# Patient Record
Sex: Female | Born: 1945 | Race: White | Hispanic: No | Marital: Married | Smoking: Current every day smoker
Health system: Southern US, Community
[De-identification: ages and names within clinical notes are randomized; demographics above are authoritative.]

## PROBLEM LIST (undated history)

## (undated) DIAGNOSIS — Z85850 Personal history of malignant neoplasm of thyroid: Secondary | ICD-10-CM

## (undated) DIAGNOSIS — R21 Rash and other nonspecific skin eruption: Secondary | ICD-10-CM

## (undated) DIAGNOSIS — Z1211 Encounter for screening for malignant neoplasm of colon: Secondary | ICD-10-CM

## (undated) DIAGNOSIS — R195 Other fecal abnormalities: Secondary | ICD-10-CM

## (undated) DIAGNOSIS — N183 Chronic kidney disease, stage 3 unspecified: Secondary | ICD-10-CM

## (undated) DIAGNOSIS — E162 Hypoglycemia, unspecified: Secondary | ICD-10-CM

## (undated) DIAGNOSIS — L97509 Non-pressure chronic ulcer of other part of unspecified foot with unspecified severity: Secondary | ICD-10-CM

## (undated) DIAGNOSIS — T4145XA Adverse effect of unspecified anesthetic, initial encounter: Secondary | ICD-10-CM

## (undated) DIAGNOSIS — T8859XA Other complications of anesthesia, initial encounter: Secondary | ICD-10-CM

## (undated) DIAGNOSIS — Z87442 Personal history of urinary calculi: Secondary | ICD-10-CM

## (undated) DIAGNOSIS — I10 Essential (primary) hypertension: Secondary | ICD-10-CM

## (undated) DIAGNOSIS — G629 Polyneuropathy, unspecified: Secondary | ICD-10-CM

## (undated) DIAGNOSIS — C519 Malignant neoplasm of vulva, unspecified: Secondary | ICD-10-CM

## (undated) DIAGNOSIS — G43909 Migraine, unspecified, not intractable, without status migrainosus: Secondary | ICD-10-CM

## (undated) DIAGNOSIS — F329 Major depressive disorder, single episode, unspecified: Secondary | ICD-10-CM

## (undated) DIAGNOSIS — F32A Depression, unspecified: Secondary | ICD-10-CM

## (undated) DIAGNOSIS — M1990 Unspecified osteoarthritis, unspecified site: Secondary | ICD-10-CM

## (undated) DIAGNOSIS — G35 Multiple sclerosis: Secondary | ICD-10-CM

## (undated) DIAGNOSIS — N390 Urinary tract infection, site not specified: Secondary | ICD-10-CM

## (undated) DIAGNOSIS — C73 Malignant neoplasm of thyroid gland: Secondary | ICD-10-CM

## (undated) HISTORY — DX: Malignant neoplasm of thyroid gland: C73

## (undated) HISTORY — DX: Essential (primary) hypertension: I10

## (undated) HISTORY — PX: LAPAROSCOPIC CHOLECYSTECTOMY: SUR755

## (undated) HISTORY — PX: TOTAL THYROIDECTOMY: SHX2547

## (undated) HISTORY — PX: BREAST BIOPSY: SHX20

## (undated) HISTORY — PX: TOE AMPUTATION: SHX809

## (undated) HISTORY — PX: TUBAL LIGATION: SHX77

## (undated) HISTORY — PX: CERVICAL FUSION: SHX112

## (undated) HISTORY — PX: TONSILLECTOMY AND ADENOIDECTOMY: SUR1326

## (undated) HISTORY — DX: Multiple sclerosis: G35

## (undated) HISTORY — PX: OTHER SURGICAL HISTORY: SHX169

---

## 1976-09-18 HISTORY — PX: ABDOMINAL HYSTERECTOMY: SHX81

## 1993-09-18 HISTORY — PX: OOPHORECTOMY: SHX86

## 1998-12-13 ENCOUNTER — Ambulatory Visit (HOSPITAL_COMMUNITY): Admission: RE | Admit: 1998-12-13 | Discharge: 1998-12-13 | Payer: Self-pay | Admitting: *Deleted

## 1998-12-17 ENCOUNTER — Ambulatory Visit (HOSPITAL_COMMUNITY): Admission: RE | Admit: 1998-12-17 | Discharge: 1998-12-17 | Payer: Self-pay | Admitting: *Deleted

## 2000-07-19 ENCOUNTER — Encounter (INDEPENDENT_AMBULATORY_CARE_PROVIDER_SITE_OTHER): Payer: Self-pay | Admitting: Specialist

## 2000-07-19 ENCOUNTER — Ambulatory Visit (HOSPITAL_COMMUNITY): Admission: RE | Admit: 2000-07-19 | Discharge: 2000-07-19 | Payer: Self-pay | Admitting: Gastroenterology

## 2000-08-15 ENCOUNTER — Ambulatory Visit (HOSPITAL_COMMUNITY): Admission: RE | Admit: 2000-08-15 | Discharge: 2000-08-15 | Payer: Self-pay | Admitting: Gastroenterology

## 2000-08-15 ENCOUNTER — Encounter (INDEPENDENT_AMBULATORY_CARE_PROVIDER_SITE_OTHER): Payer: Self-pay | Admitting: Specialist

## 2000-08-15 HISTORY — PX: COLONOSCOPY: SHX174

## 2001-11-08 ENCOUNTER — Encounter: Admission: RE | Admit: 2001-11-08 | Discharge: 2002-02-06 | Payer: Self-pay | Admitting: Family Medicine

## 2002-02-19 ENCOUNTER — Encounter: Payer: Self-pay | Admitting: Urology

## 2002-02-19 ENCOUNTER — Encounter: Admission: RE | Admit: 2002-02-19 | Discharge: 2002-02-19 | Payer: Self-pay | Admitting: Urology

## 2003-09-19 HISTORY — PX: BLADDER SUSPENSION: SHX72

## 2003-10-22 ENCOUNTER — Other Ambulatory Visit: Admission: RE | Admit: 2003-10-22 | Discharge: 2003-10-22 | Payer: Self-pay | Admitting: Gastroenterology

## 2004-10-10 ENCOUNTER — Encounter (HOSPITAL_COMMUNITY): Admission: RE | Admit: 2004-10-10 | Discharge: 2005-01-08 | Payer: Self-pay | Admitting: Endocrinology

## 2005-06-07 ENCOUNTER — Ambulatory Visit (HOSPITAL_COMMUNITY): Admission: RE | Admit: 2005-06-07 | Discharge: 2005-06-07 | Payer: Self-pay | Admitting: Endocrinology

## 2006-03-26 ENCOUNTER — Encounter (HOSPITAL_COMMUNITY): Admission: RE | Admit: 2006-03-26 | Discharge: 2006-05-30 | Payer: Self-pay | Admitting: Endocrinology

## 2006-04-16 ENCOUNTER — Encounter: Admission: RE | Admit: 2006-04-16 | Discharge: 2006-04-16 | Payer: Self-pay | Admitting: Family Medicine

## 2007-04-29 ENCOUNTER — Encounter: Admission: RE | Admit: 2007-04-29 | Discharge: 2007-04-29 | Payer: Self-pay | Admitting: Family Medicine

## 2007-10-28 ENCOUNTER — Other Ambulatory Visit: Admission: RE | Admit: 2007-10-28 | Discharge: 2007-10-28 | Payer: Self-pay | Admitting: Internal Medicine

## 2007-11-11 ENCOUNTER — Encounter (HOSPITAL_COMMUNITY): Admission: RE | Admit: 2007-11-11 | Discharge: 2008-02-03 | Payer: Self-pay | Admitting: Endocrinology

## 2008-06-30 ENCOUNTER — Encounter: Admission: RE | Admit: 2008-06-30 | Discharge: 2008-06-30 | Payer: Self-pay | Admitting: Internal Medicine

## 2008-12-04 ENCOUNTER — Inpatient Hospital Stay (HOSPITAL_COMMUNITY): Admission: EM | Admit: 2008-12-04 | Discharge: 2008-12-04 | Payer: Self-pay | Admitting: Emergency Medicine

## 2008-12-04 HISTORY — PX: CARDIAC CATHETERIZATION: SHX172

## 2010-10-09 ENCOUNTER — Encounter: Payer: Self-pay | Admitting: Internal Medicine

## 2010-10-09 ENCOUNTER — Encounter: Payer: Self-pay | Admitting: Family Medicine

## 2010-10-09 ENCOUNTER — Encounter: Payer: Self-pay | Admitting: Endocrinology

## 2010-10-17 ENCOUNTER — Encounter
Admission: RE | Admit: 2010-10-17 | Discharge: 2010-10-17 | Payer: Self-pay | Source: Home / Self Care | Attending: Internal Medicine | Admitting: Internal Medicine

## 2010-11-03 ENCOUNTER — Other Ambulatory Visit: Payer: Self-pay | Admitting: Internal Medicine

## 2010-11-03 DIAGNOSIS — N289 Disorder of kidney and ureter, unspecified: Secondary | ICD-10-CM

## 2010-11-08 ENCOUNTER — Other Ambulatory Visit: Payer: Self-pay

## 2010-12-29 LAB — CK TOTAL AND CKMB (NOT AT ARMC)
CK, MB: 5.2 ng/mL — ABNORMAL HIGH (ref 0.3–4.0)
Relative Index: 4.2 — ABNORMAL HIGH (ref 0.0–2.5)
Total CK: 124 U/L (ref 7–177)

## 2010-12-29 LAB — DIFFERENTIAL
Basophils Absolute: 0 10*3/uL (ref 0.0–0.1)
Basophils Relative: 0 % (ref 0–1)
Eosinophils Relative: 3 % (ref 0–5)
Lymphocytes Relative: 29 % (ref 12–46)
Lymphs Abs: 2 10*3/uL (ref 0.7–4.0)
Monocytes Relative: 6 % (ref 3–12)
Neutro Abs: 4.3 10*3/uL (ref 1.7–7.7)

## 2010-12-29 LAB — BASIC METABOLIC PANEL
CO2: 23 mEq/L (ref 19–32)
GFR calc Af Amer: >60 mL/min (ref >60)
GFR calc non Af Amer: >60 mL/min (ref >60)
Glucose, Bld: 100 mg/dL — ABNORMAL HIGH (ref 70–99)
Potassium: 3.5 mEq/L (ref 3.5–5.1)
Sodium: 134 mEq/L — ABNORMAL LOW (ref 135–145)

## 2010-12-29 LAB — LIPID PANEL
Cholesterol: 204 mg/dL — ABNORMAL HIGH (ref 0–200)
HDL: 38 mg/dL — ABNORMAL LOW (ref >39)
LDL Cholesterol: UNDETERMINED mg/dL (ref 0–99)
Total CHOL/HDL Ratio: 5.4 RATIO

## 2010-12-29 LAB — COMPREHENSIVE METABOLIC PANEL
Alkaline Phosphatase: 70 U/L (ref 39–117)
BUN: 13 mg/dL (ref 6–23)
CO2: 25 mEq/L (ref 19–32)
Calcium: 7.8 mg/dL — ABNORMAL LOW (ref 8.4–10.5)
Chloride: 100 mEq/L (ref 96–112)
GFR calc Af Amer: >60 mL/min (ref >60)
GFR calc non Af Amer: >60 mL/min (ref >60)
Potassium: 4.4 mEq/L (ref 3.5–5.1)
Total Bilirubin: 0.8 mg/dL (ref 0.3–1.2)

## 2010-12-29 LAB — POCT I-STAT, CHEM 8
BUN: 16 mg/dL (ref 6–23)
Calcium, Ion: 0.88 mmol/L — ABNORMAL LOW (ref 1.12–1.32)
Creatinine, Ser: 0.9 mg/dL (ref 0.4–1.2)
Glucose, Bld: 101 mg/dL — ABNORMAL HIGH (ref 70–99)
Potassium: 3.6 mEq/L (ref 3.5–5.1)

## 2010-12-29 LAB — POCT CARDIAC MARKERS
CKMB, poc: 3.9 ng/mL (ref 1.0–8.0)
CKMB, poc: 4 ng/mL (ref 1.0–8.0)
Troponin i, poc: <0.05 ng/mL (ref 0.00–0.09)

## 2010-12-29 LAB — HEPARIN LEVEL (UNFRACTIONATED): Heparin Unfractionated: <0.1 IU/mL — ABNORMAL LOW (ref 0.30–0.70)

## 2010-12-29 LAB — CBC
MCHC: 34 g/dL (ref 30.0–36.0)
RDW: 14.5 % (ref 11.5–15.5)

## 2010-12-29 LAB — MAGNESIUM: Magnesium: 2.4 mg/dL (ref 1.5–2.5)

## 2010-12-29 LAB — CARDIAC PANEL(CRET KIN+CKTOT+MB+TROPI): Total CK: 157 U/L (ref 7–177)

## 2011-01-31 NOTE — Discharge Summary (Signed)
Rhonda Butler, MACKNIGHT              ACCOUNT NO.:  [id number]   MEDICAL RECORD NO.:  [id number]          PATIENT TYPE:  INP   LOCATION:  3710                         FACILITY:  [facility]   PHYSICIAN:  Mohan N. Sharyn Lull, M.D. DATE OF BIRTH:  1945/10/14   DATE OF ADMISSION:  12/04/2008  DATE OF DISCHARGE:  12/04/2008                               DISCHARGE SUMMARY   ADMITTING DIAGNOSES:  1. Chest pain, worrisome for angina, rule out myocardial infarction  2. Uncontrolled hypertension.  3. History of tobacco abuse.  4. Morbid obesity.  5. Hypothyroidism.  6. History of cancer of the thyroid.  7. Multiple sclerosis.  8. Positive family history of coronary artery disease.   DISCHARGE DIAGNOSES:  1. Status post chest pain.  2. Mild coronary artery disease, status post left catheterization.  3. Rule out gastroesophageal reflux disease.  4. Hypertension.  5. Elevated blood sugar, rule out diabetes mellitus.  6. Hypercholesteremia.  7. History of tobacco abuse.  8. Morbid obesity.  9. Hypothyroidism.  10.History of cancer of thyroid.  11.Multiple sclerosis.  12.Positive family history of coronary artery disease.   DISCHARGE HOME MEDICATIONS:  1. Enteric-coated aspirin 81 mg 1 tablet daily.  2. Toprol-XL 100 mg 1 tablet daily.  3. Norvasc 5 mg 1 tablet daily.  4. Lopid 600 mg 1 tablet twice daily.  5. Cymbalta 30 mg 1 capsule daily.  6. Levoxyl 112 mcg 1 tablet daily.  7. Calcitriol 0.5 mcg 1 capsule twice daily as before.  8. Amantadine 100 mg 1 capsule twice daily.  9. Estradiol 1 mg 1 tablet daily as before.   DIET:  Low salt, low cholesterol.  The patient has been advised to avoid  sweets.   Post-cardiac cath instructions have been given.  No driving, no lifting,  no pushing or pulling for 1 week.  Fasting blood sugar and hemoglobin  A1c in 1 week.   CONDITION AT DISCHARGE:  Stable.   FOLLOWUP:  With me in 1 week.   BRIEF HISTORY AND HOSPITAL COURSE:  Rhonda Butler is a  65 year old white  female with past medical history significant for hypertension, multiple  sclerosis, tobacco abuse, morbid obesity, history of CA of thyroid, and  positive family history of coronary artery disease.  She came to the ER  complaining of retrosternal chest heaviness and pressure, grade 7/10,  associated with nausea and mild shortness of breath.  The patient gives  history of exertional dyspnea associated with feeling weak and  diaphoretic.  She states she had stress test more than 1 year ago which  was negative.  Denies any palpitation, lightheadedness, or syncope.  Denies relation of chest pain to food, breathing, or movement.   PAST MEDICAL HISTORY:  As above.   PAST SURGICAL HISTORY:  She had a partial thyroidectomy in the past, had  cholecystectomy in the past, had tonsillectomy in the past, had bladder  sling surgery in the past, and had cervical neck fusion in the past.   ALLERGIES:  She is allergic to MORPHINE, SULFA, and DARVON.   SOCIAL HISTORY:  She is married, 2 children, retired Industrial/product designer  Water engineer.  She smoked more than 2 packs per day for 30+ years, quit 11  years ago.  No history of alcohol abuse.   FAMILY HISTORY:  Father died of MI, he also had stroke.  Mother died of  probable lymphocytic leukemia.  One sister had lupus.   PHYSICAL EXAMINATION:  GENERAL:  She is alert, awake, and oriented x3 in  no acute distress.  VITAL SIGNS:  Her initial blood pressure when she came to the hospital  was 205/100, repeat blood pressure was 165/86.  Pulse was 69 and  regular.  HEENT:  Conjunctivae were pink.  NECK:  Supple.  No JVD.  No bruit.  LUNGS:  Clear to auscultation without rhonchi or rales.  CARDIOVASCULAR:  S1 and S2 was normal.  There was soft systolic murmur  and S4 gallop.  ABDOMEN:  Soft, bowel sounds were present, nontender.  EXTREMITIES:  There is no clubbing, cyanosis, or edema.   LABORATORY DATA:  Her hemoglobin was 12.8, hematocrit 37.6,  white count  of 6.9, and platelet count of 195,000.  Her sodium was 134, potassium  3.5, chloride 100, bicarb 23, glucose 100, BUN 14, creatinine 0.67, and  magnesium was 2.4.  Troponin I was less than 0.01.  CPK was 124 and MB  5.2.  Second set CPK-MB was 4.  Troponin I point of care was less than  0.05.  Third set CK point of care was 3.9.  Troponin I was less than  0.05.  Her cholesterol was 204, triglycerides were 960, and HDL was 38.   BRIEF HOSPITAL COURSE:  The patient was admitted to telemetry unit.  MI  was ruled out by serial enzymes and EKG.  Discussed with the patient at  length regarding noninvasive stress testing versus left cath, possible  PTCA stenting, its risks and benefits, i.e., death, MI, stroke, need for  emergency CABG, risk of restenosis, local vascular complications, etc.,  and consented for PCI.  The patient subsequently underwent left cardiac  cath with selective left and right coronary angiography as per procedure  report.  The patient tolerated the procedure well.  There are no  complications.  Postprocedure, the patient did not have any episodes of  chest pain.  Her groin is stable with no  evidence of hematoma or bruit.  The patient will be discharged home  later this evening if her groin remains stable and she remains  hemodynamically stable.  Her blood pressure has come down in 140/80  range.  The patient will be discharged home on above medications and  will be followed up in my office in 1 week.      Eduardo Osier. Sharyn Lull, M.D.  Electronically Signed     MNH/MEDQ  D:  12/04/2008  T:  12/05/2008  Job:  [id number]

## 2011-01-31 NOTE — Cardiovascular Report (Signed)
NAMEKAREE, FORGE              ACCOUNT NO.:  [id number]   MEDICAL RECORD NO.:  [id number]          PATIENT TYPE:  INP   LOCATION:  3710                         FACILITY:  [facility]   PHYSICIAN:  Mohan N. Sharyn Lull, M.D. DATE OF BIRTH:  Jun 09, 1946   DATE OF PROCEDURE:  12/04/2008  DATE OF DISCHARGE:  12/04/2008                            CARDIAC CATHETERIZATION   PROCEDURE:  Left cardiac catheterization with selective left and right  coronary angiography, left ventriculography via right groin using  Judkins technique.   INDICATIONS FOR PROCEDURE:  Ms. Mcgriff is a 65 year old white female  with past medical history significant for hypertension, multiple  sclerosis, history of tobacco abuse, morbid obesity, history of CA of  thyroid, and positive family history of coronary artery disease.  She  came to the ER complaining of retrosternal chest heaviness, pressure  grade 7/10 associated with nausea and mild shortness of breath.  The  patient gives history of exertional dyspnea associated with feeling weak  and diaphoresis.  States had stress test more than 1 year ago which was  normal.  Denies any palpitation, lightheadedness, or syncope.  Denies  relation of chest pain to food, breathing, or movement.   PAST MEDICAL HISTORY:  As above.   PAST SURGICAL HISTORY:  She had partial thyroidectomy in the past, had  cholecystectomy in the past, had tonsillectomy in the past, had bladder  sling surgery in the past, and had cervical neck fusion in the past.   ALLERGIES:  She is allergic to MORPHINE, SULFA, and DARVON.   SOCIAL HISTORY:  She is married, two children.  Retired Paramedic.  She smoked more than 2 packs per day for 30+ years, quit 11  years ago.  No history of alcohol abuse.   FAMILY HISTORY:  Father died of MI.  He had also stroke.  Mother died of  lymphocytic leukemia.  One sister had lupus.   PHYSICAL EXAMINATION:  GENERAL:  She is alert, awake, and oriented x3  in  no acute distress.  VITAL SIGNS:  Blood pressure was 165/86, pulse was 69 and regular.  HEENT:  Conjunctivae were pink.  NECK:  Supple.  No JVD, no bruit.  LUNGS:  Clear to auscultation without rhonchi or rales.  CARDIOVASCULAR:  S1-S2 were normal.  There was soft systolic murmur and  S4 gallop.  ABDOMEN:  Soft.  Bowel sounds were present and nontender.  EXTREMITIES:  There is no clubbing, cyanosis, or edema.   LABORATORY DATA:  Hemoglobin was 13.7 and hematocrit 39.  CPK was 124,  MB 5.2, and troponin I was 0.01.  EKG showed normal sinus rhythm with  nonspecific T-wave changes.   I discussed with the patient at length regarding noninvasive stress  testing versus left cath, possible PTCA and stenting, its risks and  benefits, i.e. death, MI, stroke, need for emergency CABG, risk of  restenosis, local vascular complications, etc. and consented for PCI.   PROCEDURE IN DETAIL:  After obtaining the informed consent, the patient  was brought to the cath lab and was placed on fluoroscopy table.  Right  groin was  prepped and draped in usual fashion.  Xylocaine 2% was used  for local anesthesia in the right groin.  With the help of thin-walled  needle, a 6-French arterial sheath was placed.  The sheath was aspirated  and flushed.  Next, a 6-French left Judkins catheter was advanced over  the wire under fluoroscopic guidance up to the ascending aorta.  Wire  was pulled out.  The catheter was aspirated and connected to the  manifold.  Catheter was further advanced and engaged into the left  coronary ostium.  Multiple views of the left system were taken.  Next,  the catheter was disengaged and was pulled out over the wire and was  replaced with 6-French right Judkins catheter which was advanced over  the wire under fluoroscopic guidance up to the ascending aorta.  Wire  was pulled out.  The catheter was aspirated and connected to the  manifold.  Catheter was further advanced and engaged  into right coronary  ostium.  Multiple views of the right system were taken.  Next, the  catheter was disengaged and was pulled out over the wire and was  replaced with 6-French pigtail catheter which was advanced over the wire  under fluoroscopic guidance up to the ascending aorta.  Wire was pulled  out.  The catheter was aspirated and connected to the manifold.  Catheter was further advanced across the aortic valve into the LV.  LV  pressures were recorded.  Next, LV-graphy was done in 30-degree RAO  position.  Post-angiographic pressures were recorded from LV and then  pullback pressures were recorded from the aorta.  There was no gradient  across the aortic valve.  Next, the pigtail catheter was pulled out over  the wire.  Sheaths were aspirated and flushed.   FINDINGS:  LV showed good LV systolic function.  EF of 55-60%.  Left  main was patent.  LAD has 10-15% proximal and mid stenosis.  Diagonal 1  was moderate size which was patent.  Diagonal 2 was small which was  patent.  Left circumflex has 10-15% ostial stenosis.  OM-1 and OM-2 were  very very small.  OM-3 was moderate size which was patent.  OM-4 was  small which was patent.  RCA has 10-15% ostial and 10-20% proximal and  mid stenosis.  PDA and PLV branches were patent.  The patient tolerated  the procedure well.  There were no complications.  The patient was  transferred to the recovery room in stable condition.      Eduardo Osier. Sharyn Lull, M.D.  Electronically Signed     MNH/MEDQ  D:  12/04/2008  T:  12/05/2008  Job:  [id number]

## 2011-02-03 NOTE — Procedures (Signed)
[facility]  Patient:    Rhonda Butler, Rhonda Butler                     MRN: [id number] Proc. Date: 07/19/00 Adm. Date:  [id number] Attending:  Rich Brave                           Procedure Report  PROCEDURE:  Upper endoscopy with biopsies.  INDICATION:  A 65 year old female with frequent liquid, oily stools, raising the question of possible malabsorption.  FINDINGS:  Small amount of bile reflux, otherwise unremarkable examination. Pathology pending.  DESCRIPTION OF PROCEDURE:  The nature, purpose, and risks of the procedure have been discussed with the patient, who provided written consent.  Sedation was fentanyl 112.5 mcg and Versed 10 mg IV, without arrhythmias or desaturation.  The Olympus adult video endoscope was passed under direct vision.  The vocal cords looked normal, and the esophagus was easily entered.  The esophagus was endoscopically normal, without evidence of reflux esophagitis, Barretts esophagus, varices, infection, or neoplasia.  No ring or stricture was evident.  There was no hiatal hernia.  The stomach was entered.  It contained a small to moderate bilious residual, which was suctioned up.  The gastric mucosa was unremarkable, without evidence of gastritis, erosions, ulcers, polyps, or masses, including retroflex view of the proximal stomach.  The pylorus, duodenal bulb, and second duodenum looked normal.  In particular, there was no endoscopic evidence of typical markers of of celiac disease. However, multiple mucosal biopsies from the second and proximal third portion of the duodenum were obtained.  The scope was then removed, and the patient tolerated the procedure well and without apparent complication.  IMPRESSION:  Small amount of bile reflux, otherwise normal endoscopy.  PLAN:  Await pathology on duodenal biopsies.  Proceed to sigmoidoscopic evaluation. DD:  07/19/00 TD:  07/20/00 Job: [id number]

## 2011-02-03 NOTE — Procedures (Signed)
[facility]  Patient:    Rhonda Butler, Rhonda Butler                     MRN: [id number] Proc. Date: 07/19/00 Adm. Date:  [id number] Attending:  Rich Brave                           Procedure Report  PROCEDURE:  Flexible sigmoidoscopy with biopsies.  INDICATION:  A 65 year old female with diarrhea and previous history of possible ulcerative colitis.  FINDINGS:  No evidence of ulcerative colitis identified.  A 3-4 mm sessile polyp biopsied near splenic flexure.  DESCRIPTION OF PROCEDURE:  The nature, purpose, and risks of the procedure had been discussed with the patient, who provided written consent.  Sedation for this procedure was simply the sedation which had been given for her preceding upper endoscopy, fentanyl 112.5 mcg and Versed 10 mg IV, for a level of mild sedation without arrhythmias or desaturation.  The Olympus adult video upper endoscope which had been used for endoscopy was also used for this procedure and was advanced to about 65 or 70 cm, course running to the distal transverse colon, whereupon pullback was initiated.  At 60 cm there was a 3-4 mm sessile polyp, which I biopsied several times.  This was otherwise a normal exam without evidence of ulcerative colitis or other mucosal abnormalities and without other polyps, cancer, vascular malformations, or diverticulosis.  Random mucosal biopsies were obtained in the sigmoid colon and rectum, and retroflexion was consequently not performed.  The patient tolerated the procedure well, and there were no apparent complications.  IMPRESSION:  Small polyp in the distal transverse colon, otherwise unremarkable exam.  PLAN:  Await pathology.  If the polyp is adenomatous, arrange colonoscopy. DD:  07/19/00 TD:  07/20/00 Job: [id number]

## 2011-02-03 NOTE — Op Note (Signed)
[facility]  Patient:    Rhonda Butler, Rhonda Butler                     MRN: [id number] Proc. Date: 08/15/00 Adm. Date:  [id number] Disc. Date: [id number] Attending:  Rich Brave                           Operative Report  PROCEDURE:  Colonoscopy.  ENDOSCOPIST:  Florencia Reasons, M.D.  INDICATIONS:  Small adenoma seen and removed on recent flexible sigmoidoscopy. Rule out synchronous lesions elsewhere in the colon.  FINDINGS:  Normal exam to cecum except for retained stool.  DESCRIPTION OF PROCEDURE:  The nature, purpose, and risks of the procedure were familiar to the patient who provided written consent.  Sedation was fentanyl 125 mcg and Versed 12 mg IV prior to and during the course of the procedure without arrhythmias or desaturation.  The Olympus adult video colonoscope was advanced to the cecum, turning the patient into the supine position and applying some external abdominal compression to facilitate passage.  The cecum was identified by clear visualization of the ileocecal valve and the absence of further lumen.  Pullback was then performed.  There was a moderate amount of solid formed stool scattered here and there, both in the cecum and also in the proximal and mid colon which could have obscured small lesions.  In many cases, I was able to irrigate this out of the way, but, in other cases, there was too much to irrigate or simply seemed that irrigation did not expose the underlying mucosa; so there were areas of mucosa that were not visualized during this exam.  From a practical standpoint, I do not think it would have been feasible to adequately wash out the entire colon.  No polyps, cancer, colitis, vascular malformations, or diverticular disease were observed during this exam.  The patient has a past history of supposed "ulcerative colitis," although I do not have documentation of that, and there was certainly no endoscopic  evidence of colitis at this time, but I did obtain random mucosal biopsies along the length of the colon to look for histologic evidence of inflammation.  Retroflexion was not performed in the rectum.  The patient tolerated this procedure well, and there were no apparent complications.  IMPRESSION: 1. No evidence of current active colitis. 2. No evidence of synchronous polyps, status post removal of a small adenoma    from the region of the splenic flexure on sigmoidoscopy about a month ago.  PLAN:  Await pathology on current biopsies.  Followup colonoscopy in five years in view of the history of the adenoma having recently been removed.DD: 08/15/00 TD:  08/15/00 Job: [id number]

## 2011-06-12 LAB — THYROGLOBULIN LEVEL: Antithyroglobulin Ab: 0.6 IU/mL (ref 0.0–14.4)

## 2012-02-29 ENCOUNTER — Ambulatory Visit
Admission: RE | Admit: 2012-02-29 | Discharge: 2012-02-29 | Disposition: A | Discharge status: Home / Self Care | Payer: Managed Care, Other (non HMO) | Source: Ambulatory Visit | Attending: Specialist | Admitting: Specialist

## 2012-02-29 ENCOUNTER — Other Ambulatory Visit: Payer: Self-pay | Admitting: Specialist

## 2012-02-29 DIAGNOSIS — M479 Spondylosis, unspecified: Secondary | ICD-10-CM

## 2012-05-01 ENCOUNTER — Other Ambulatory Visit: Payer: Self-pay | Admitting: Specialist

## 2012-05-01 ENCOUNTER — Ambulatory Visit
Admission: RE | Admit: 2012-05-01 | Discharge: 2012-05-01 | Disposition: A | Discharge status: Home / Self Care | Payer: Managed Care, Other (non HMO) | Source: Ambulatory Visit | Attending: Specialist | Admitting: Specialist

## 2012-05-01 DIAGNOSIS — Z981 Arthrodesis status: Secondary | ICD-10-CM

## 2013-11-05 ENCOUNTER — Other Ambulatory Visit (HOSPITAL_COMMUNITY): Payer: Self-pay | Admitting: Cardiology

## 2013-11-05 DIAGNOSIS — N184 Chronic kidney disease, stage 4 (severe): Secondary | ICD-10-CM

## 2013-11-06 ENCOUNTER — Encounter: Payer: Self-pay | Admitting: Internal Medicine

## 2013-11-06 ENCOUNTER — Ambulatory Visit (HOSPITAL_COMMUNITY): Payer: BC Managed Care – PPO | Attending: Internal Medicine

## 2013-11-06 DIAGNOSIS — I10 Essential (primary) hypertension: Secondary | ICD-10-CM

## 2013-11-06 DIAGNOSIS — N189 Chronic kidney disease, unspecified: Secondary | ICD-10-CM | POA: Insufficient documentation

## 2013-11-06 DIAGNOSIS — N184 Chronic kidney disease, stage 4 (severe): Secondary | ICD-10-CM

## 2013-11-06 DIAGNOSIS — I129 Hypertensive chronic kidney disease with stage 1 through stage 4 chronic kidney disease, or unspecified chronic kidney disease: Secondary | ICD-10-CM | POA: Insufficient documentation

## 2013-11-06 DIAGNOSIS — I2510 Atherosclerotic heart disease of native coronary artery without angina pectoris: Secondary | ICD-10-CM | POA: Insufficient documentation

## 2013-11-06 DIAGNOSIS — E785 Hyperlipidemia, unspecified: Secondary | ICD-10-CM | POA: Insufficient documentation

## 2014-02-11 ENCOUNTER — Other Ambulatory Visit: Payer: Self-pay | Admitting: Internal Medicine

## 2014-02-11 DIAGNOSIS — E039 Hypothyroidism, unspecified: Secondary | ICD-10-CM

## 2014-02-13 ENCOUNTER — Ambulatory Visit
Admission: RE | Admit: 2014-02-13 | Discharge: 2014-02-13 | Disposition: A | Discharge status: Home / Self Care | Payer: BC Managed Care – PPO | Source: Ambulatory Visit | Attending: Internal Medicine | Admitting: Internal Medicine

## 2014-02-13 DIAGNOSIS — E039 Hypothyroidism, unspecified: Secondary | ICD-10-CM

## 2014-07-02 ENCOUNTER — Other Ambulatory Visit: Payer: Self-pay

## 2014-07-02 DIAGNOSIS — Z1231 Encounter for screening mammogram for malignant neoplasm of breast: Secondary | ICD-10-CM

## 2014-07-03 ENCOUNTER — Other Ambulatory Visit: Payer: Self-pay

## 2014-07-15 ENCOUNTER — Ambulatory Visit
Admission: RE | Admit: 2014-07-15 | Discharge: 2014-07-15 | Disposition: A | Discharge status: Home / Self Care | Payer: Medicare Other | Source: Ambulatory Visit

## 2014-07-15 DIAGNOSIS — Z1231 Encounter for screening mammogram for malignant neoplasm of breast: Secondary | ICD-10-CM

## 2014-11-04 ENCOUNTER — Other Ambulatory Visit (HOSPITAL_COMMUNITY): Payer: Self-pay | Admitting: Cardiology

## 2014-11-04 DIAGNOSIS — I739 Peripheral vascular disease, unspecified: Secondary | ICD-10-CM

## 2014-11-06 ENCOUNTER — Ambulatory Visit (HOSPITAL_COMMUNITY)
Admission: RE | Admit: 2014-11-06 | Discharge: 2014-11-06 | Disposition: A | Discharge status: Home / Self Care | Payer: BLUE CROSS/BLUE SHIELD | Source: Ambulatory Visit | Attending: Cardiology | Admitting: Cardiology

## 2014-11-06 DIAGNOSIS — L97529 Non-pressure chronic ulcer of other part of left foot with unspecified severity: Secondary | ICD-10-CM | POA: Insufficient documentation

## 2014-11-06 DIAGNOSIS — I739 Peripheral vascular disease, unspecified: Secondary | ICD-10-CM | POA: Diagnosis not present

## 2014-11-06 DIAGNOSIS — R200 Anesthesia of skin: Secondary | ICD-10-CM | POA: Diagnosis not present

## 2014-11-06 DIAGNOSIS — R202 Paresthesia of skin: Secondary | ICD-10-CM | POA: Insufficient documentation

## 2014-11-06 DIAGNOSIS — L97509 Non-pressure chronic ulcer of other part of unspecified foot with unspecified severity: Secondary | ICD-10-CM | POA: Diagnosis present

## 2014-11-06 DIAGNOSIS — L97519 Non-pressure chronic ulcer of other part of right foot with unspecified severity: Secondary | ICD-10-CM | POA: Diagnosis not present

## 2015-03-15 ENCOUNTER — Other Ambulatory Visit: Payer: Self-pay

## 2015-06-18 ENCOUNTER — Other Ambulatory Visit: Payer: Self-pay

## 2015-06-18 DIAGNOSIS — Z1231 Encounter for screening mammogram for malignant neoplasm of breast: Secondary | ICD-10-CM

## 2015-07-28 ENCOUNTER — Ambulatory Visit: Payer: BLUE CROSS/BLUE SHIELD

## 2015-08-31 ENCOUNTER — Ambulatory Visit
Admission: RE | Admit: 2015-08-31 | Discharge: 2015-08-31 | Disposition: A | Discharge status: Home / Self Care | Payer: BLUE CROSS/BLUE SHIELD | Source: Ambulatory Visit

## 2015-08-31 DIAGNOSIS — Z1231 Encounter for screening mammogram for malignant neoplasm of breast: Secondary | ICD-10-CM

## 2015-09-06 ENCOUNTER — Other Ambulatory Visit: Payer: Self-pay | Admitting: Internal Medicine

## 2015-09-06 DIAGNOSIS — R928 Other abnormal and inconclusive findings on diagnostic imaging of breast: Secondary | ICD-10-CM

## 2015-09-09 ENCOUNTER — Ambulatory Visit
Admission: RE | Admit: 2015-09-09 | Discharge: 2015-09-09 | Disposition: A | Discharge status: Home / Self Care | Payer: BLUE CROSS/BLUE SHIELD | Source: Ambulatory Visit | Attending: Internal Medicine | Admitting: Internal Medicine

## 2015-09-09 ENCOUNTER — Ambulatory Visit
Admission: RE | Admit: 2015-09-09 | Discharge: 2015-09-09 | Disposition: A | Discharge status: Home / Self Care | Payer: Medicare Other | Source: Ambulatory Visit | Attending: Internal Medicine | Admitting: Internal Medicine

## 2015-09-09 DIAGNOSIS — R928 Other abnormal and inconclusive findings on diagnostic imaging of breast: Secondary | ICD-10-CM

## 2016-08-07 ENCOUNTER — Other Ambulatory Visit: Payer: Self-pay | Admitting: Internal Medicine

## 2016-08-07 DIAGNOSIS — Z1231 Encounter for screening mammogram for malignant neoplasm of breast: Secondary | ICD-10-CM

## 2016-09-15 ENCOUNTER — Ambulatory Visit
Admission: RE | Admit: 2016-09-15 | Discharge: 2016-09-15 | Disposition: A | Discharge status: Home / Self Care | Payer: Medicare Other | Source: Ambulatory Visit | Attending: Internal Medicine | Admitting: Internal Medicine

## 2016-09-15 DIAGNOSIS — Z1231 Encounter for screening mammogram for malignant neoplasm of breast: Secondary | ICD-10-CM

## 2017-08-15 ENCOUNTER — Other Ambulatory Visit: Payer: Self-pay | Admitting: Internal Medicine

## 2017-08-15 DIAGNOSIS — Z1231 Encounter for screening mammogram for malignant neoplasm of breast: Secondary | ICD-10-CM

## 2017-09-25 ENCOUNTER — Ambulatory Visit
Admission: RE | Admit: 2017-09-25 | Discharge: 2017-09-25 | Disposition: A | Discharge status: Home / Self Care | Payer: Medicare Other | Source: Ambulatory Visit | Attending: Internal Medicine | Admitting: Internal Medicine

## 2017-09-25 DIAGNOSIS — Z1231 Encounter for screening mammogram for malignant neoplasm of breast: Secondary | ICD-10-CM

## 2017-12-18 ENCOUNTER — Ambulatory Visit (INDEPENDENT_AMBULATORY_CARE_PROVIDER_SITE_OTHER): Payer: Medicare Other | Admitting: Gynecology

## 2017-12-18 ENCOUNTER — Encounter: Payer: Self-pay | Admitting: Gynecology

## 2017-12-18 VITALS — BP 120/78 | Ht 61.0 in | Wt 154.0 lb

## 2017-12-18 DIAGNOSIS — Z1272 Encounter for screening for malignant neoplasm of vagina: Secondary | ICD-10-CM | POA: Diagnosis not present

## 2017-12-18 DIAGNOSIS — Z01411 Encounter for gynecological examination (general) (routine) with abnormal findings: Secondary | ICD-10-CM

## 2017-12-18 DIAGNOSIS — N952 Postmenopausal atrophic vaginitis: Secondary | ICD-10-CM | POA: Diagnosis not present

## 2017-12-18 DIAGNOSIS — Z779 Other contact with and (suspected) exposures hazardous to health: Secondary | ICD-10-CM | POA: Diagnosis not present

## 2017-12-18 DIAGNOSIS — N9089 Other specified noninflammatory disorders of vulva and perineum: Secondary | ICD-10-CM

## 2017-12-18 NOTE — Patient Instructions (Signed)
Follow-up for the colposcopy appointment as scheduled to look at the areas  on the vulva.  Schedule a screening colonoscopy

## 2017-12-18 NOTE — Addendum Note (Signed)
Addended by: Alen Blew on: 12/18/2017 02:43 PM   Modules accepted: Level of Service

## 2017-12-18 NOTE — Progress Notes (Signed)
Rhonda Butler Jan 25, 1946 [id number]        72 y.o.  G2P2 patient who presents for 2 reasons:  1. Has not had a GYN exam in a number of years.  History of abnormal Pap smears in the past proceeding her hysterectomy which was performed for heavy bleeding.  She subsequently had a BSO as a separate procedure. 2. Tender left labial area.  First noticed over a year ago.  Has become more tender and has changed in pigmentation to self exam.  She feels now that she has a growth in this area.  Past medical history,surgical history, problem list, medications, allergies, family history and social history were all reviewed and documented as reviewed in the EPIC chart.  ROS:  Performed with pertinent positives and negatives included in the history, assessment and plan.   Additional significant findings : None   Exam: Caryn Bee assistant Vitals:   12/18/17 1025  BP: 120/78  Weight: 154 lb (69.9 kg)  Height: 5\' 1"  (1.549 m)   Body mass index is 29.1 kg/m.  General appearance:  Normal affect, orientation and appearance. Skin: Grossly normal HEENT: Without gross lesions.  No cervical or supraclavicular adenopathy. Thyroid normal.  Lungs:  Clear without wheezing, rales or rhonchi Cardiac: RR, without RMG Abdominal:  Soft, nontender, without masses, guarding, rebound, organomegaly or hernia Breasts:  Examined lying and sitting without masses, retractions, discharge or axillary adenopathy. Pelvic:  Ext, BUS, Vagina: With atrophic changes.  Tender leukoplakic area upper left labia minora.  Raised plaque-like hyperpigmented area medial to the leukoplakic area.  Several other scattered small pigmented areas not raised as diagrammed.  Pap smear of vaginal cuff taken.  Adnexa: Without masses or tenderness    Anus and perineum: Normal   Rectovaginal: Normal sphincter tone without palpated masses or tenderness.  Physical Exam  Genitourinary:          Assessment/Plan:  72 y.o. G2P2 new  patient.  1. Breast and pelvic exam. 1. Mammogram 09/2017.  Breast exam normal today.  Continue with annual mammography when due. 2. Colonoscopy 12 years ago.  Does have a history of polyps.  Recommended baseline screening colonoscopy now and patient agrees to call and schedule. 3. DEXA a number of years ago.  Patient status post thyroidectomy and parathyroidectomy for thyroid cancer.  Had been on PTH replacement with a history of hypercalcemia in the past.  Being followed now for this now as well as kidney disease by Dr Lorrene Reid in nephrology..  I recommended she discuss bone density studies with Dr. Lorrene Reid who would be able to help her with decisions as far as treatment if needed. 4. Pap smear a number of years ago.  Pap smear of vaginal cuff done today given my suspicion for VIN.  No history of significant abnormal Pap smears in the past.  Patient does note that her husband's previous wife had a history of genital warts. 5. Health maintenance.  No routine lab work done as patient does this elsewhere. 2. Vulvar lesions.  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  We discussed my suspicions and I recommended a follow-up colposcopy appointment to better define the lesions.  We will plan on biopsying the 2 more prominent lesions as described above as well as possible other lesions as visualized.  We discussed what to expect with the biopsy and the possibilities with the results.  The patient will schedule in follow-up for the colposcopy and biopsy appointment.  Additional 10 minute time in  excess of her breast and pelvic exam was spent in direct face to face counseling and coordination of care in regards to her vulvar lesions.     Anastasio Auerbach MD, 10:52 AM 12/18/2017

## 2017-12-18 NOTE — Addendum Note (Signed)
Addended by: Nelva Nay on: 12/18/2017 11:50 AM   Modules accepted: Orders

## 2017-12-19 ENCOUNTER — Telehealth: Payer: Self-pay | Admitting: *Deleted

## 2017-12-19 LAB — PAP IG W/ RFLX HPV ASCU

## 2017-12-19 NOTE — Telephone Encounter (Signed)
Pt informed with the below note. 

## 2017-12-19 NOTE — Telephone Encounter (Signed)
Pt was seen yesterday and thought her urine was going to be ran, I explained it was not due to no mention at OV about urinary symptoms. Pt said she started having symptoms burning with urination and frequent urination (which is not new for her). Patient said she doesn't feel "well". Please advise

## 2017-12-19 NOTE — Telephone Encounter (Signed)
Options include office visit for evaluation, drop off urine for urine analysis, follow-up with primary physician to evaluate just not feeling well

## 2017-12-26 ENCOUNTER — Encounter: Payer: Self-pay | Admitting: Gynecology

## 2017-12-26 ENCOUNTER — Telehealth: Payer: Self-pay | Admitting: *Deleted

## 2017-12-26 ENCOUNTER — Ambulatory Visit (INDEPENDENT_AMBULATORY_CARE_PROVIDER_SITE_OTHER): Payer: Medicare Other | Admitting: Gynecology

## 2017-12-26 VITALS — BP 118/76

## 2017-12-26 DIAGNOSIS — N901 Moderate vulvar dysplasia: Secondary | ICD-10-CM

## 2017-12-26 DIAGNOSIS — N9089 Other specified noninflammatory disorders of vulva and perineum: Secondary | ICD-10-CM

## 2017-12-26 NOTE — Telephone Encounter (Signed)
Prescription strength ibuprofen 800 mg every 8 hours as needed #30 no refill

## 2017-12-26 NOTE — Telephone Encounter (Signed)
Patient had C&B today called stating the biopsy areas have lots of discomfort, using Neosporin pain relief, but no relief. Pt said she feels nauseated due to discomfort. Patient would like something to help with this. Please advise

## 2017-12-26 NOTE — Progress Notes (Signed)
    Rhonda Butler 1946-04-22 [id number]        72 y.o.  G2P2 presents for vulvar biopsy.  Was recently seen after not having had a GYN exam in a number of years complaining of a tender area on the vulva.  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  Patient returns now for biopsy.  Past medical history,surgical history, problem list, medications, allergies, family history and social history were all reviewed and documented in the EPIC chart.  Directed ROS with pertinent positives and negatives documented in the history of present illness/assessment and plan.  Exam: Caryn Bee assistant Vitals:   12/26/17 1057  BP: 118/76   General appearance:  Normal External BUS vagina with atrophic changes.  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  Several well demarcated-like areas inner aspect of left labia minora onto upper introital region.  Colposcopy performed after acetic acid cleanse confirming leukoplakic raised lesion left upper labia minora.  Several well demarcated acetowhite changes inner aspect of the left labia as diagrammed. Physical Exam  Genitourinary:      Seizure: The skin overlying and surrounding the lesions was cleansed with Betadine.  The 2 biopsy site areas were infiltrated with 1% lidocaine and subsequently represent a biopsies from both areas taken and sent separately.  Silver nitrate/Monsel solution used for hemostasis.  Patient tolerated well.  Postoperative care discussed.  Assessment/Plan:  72 y.o. G2P2 with lesions suggestive of high-grade VIN/early invasion.  Representative biopsies taken.  Patient will follow-up for biopsy results.  We discussed various scenarios for VIN to include re-excision of the other areas, possible laser, possible referral to a gynecologic oncologist for more extensive excision.  If returns carcinoma patient knows regardless we will refer to gynecologic oncologist.    Anastasio Auerbach MD, 11:21 AM 12/26/2017

## 2017-12-26 NOTE — Patient Instructions (Signed)
Office will call with biopsy results 

## 2017-12-26 NOTE — Telephone Encounter (Signed)
Pt informed with the below note, declined Rx at the time states she has ibuprofen at home

## 2017-12-31 ENCOUNTER — Encounter: Payer: Self-pay | Admitting: Gynecology

## 2018-01-01 ENCOUNTER — Telehealth: Payer: Self-pay | Admitting: Gynecology

## 2018-01-01 NOTE — Telephone Encounter (Signed)
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  I discussed the diagnosis of cancer and the low likelihood of metastases.  The need for local excision and possible lymph node assessment discussed.  My recommendation is that she is evaluated and managed by a gynecologic oncologist which she agrees with and we will go ahead and make these arrangements.    Set up an appointment with a gynecologic oncologist

## 2018-01-01 NOTE — Telephone Encounter (Signed)
Sent message to GYN oncologist to set up appointment.

## 2018-01-02 NOTE — Telephone Encounter (Signed)
Pt scheduled on 01/09/18 @  1:30pm with Dr.Phelps, left message for pt to call.

## 2018-01-02 NOTE — Telephone Encounter (Signed)
Sharyn Lull spoke with patient already,pt aware.

## 2018-01-03 ENCOUNTER — Ambulatory Visit: Payer: Medicare Other | Admitting: Obstetrics

## 2018-01-09 ENCOUNTER — Encounter: Payer: Self-pay | Admitting: Obstetrics

## 2018-01-09 ENCOUNTER — Telehealth: Payer: Self-pay

## 2018-01-09 ENCOUNTER — Inpatient Hospital Stay: Payer: Medicare Other | Attending: Obstetrics | Admitting: Obstetrics

## 2018-01-09 VITALS — BP 112/66 | HR 64 | Temp 98.8°F | Resp 20 | Ht 61.0 in | Wt 151.2 lb

## 2018-01-09 DIAGNOSIS — Z85850 Personal history of malignant neoplasm of thyroid: Secondary | ICD-10-CM | POA: Insufficient documentation

## 2018-01-09 DIAGNOSIS — Z79899 Other long term (current) drug therapy: Secondary | ICD-10-CM | POA: Diagnosis not present

## 2018-01-09 DIAGNOSIS — C519 Malignant neoplasm of vulva, unspecified: Secondary | ICD-10-CM | POA: Diagnosis present

## 2018-01-09 DIAGNOSIS — G35 Multiple sclerosis: Secondary | ICD-10-CM | POA: Diagnosis not present

## 2018-01-09 DIAGNOSIS — I10 Essential (primary) hypertension: Secondary | ICD-10-CM | POA: Diagnosis not present

## 2018-01-09 DIAGNOSIS — N390 Urinary tract infection, site not specified: Secondary | ICD-10-CM | POA: Diagnosis not present

## 2018-01-09 DIAGNOSIS — F17210 Nicotine dependence, cigarettes, uncomplicated: Secondary | ICD-10-CM | POA: Insufficient documentation

## 2018-01-09 NOTE — Patient Instructions (Signed)
Plan to have a wide local excision or partial vulvectomy with Dr. Gerarda Fraction at the [facility] on Jan 30, 2018.  You will receive a phone call from the pre-surgical RN to discuss instructions.  Please call for any questions or concerns.

## 2018-01-09 NOTE — Progress Notes (Signed)
Consult Note: Gyn-Onc  Consult was requested by Dr. Phineas Real for the evaluation of Rhonda Butler 72 y.o. female  CC:  Chief Complaint  Patient presents with  . Vulvar Cancer    "microinvasion" on biopsy NOS    HPI: [name]  is a very nice 72 y.o.  P2  She has a history of chronic urinary tract infections.  She was actually referred to urology for this diagnosis.  She was prescribed Premarin to try to alleviate presumably her atrophy which sounds like was believed to be contributing to her symptoms.  She was also prescribed daily antibiotics for the chronic urinary tract infection.  Given the symptoms she thought that her vulvar discomfort was related to her urinary tract infections.  She ultimately discussed the worsening discomfort of the vulva with her primary care provider who then referred the patient on to gynecology where biopsies were performed of an area on the vulva at 1:00.  12/26/2017 biopsy of labia my Alinda Sierras showed high-grade neoplasia with microinvasion not further specified.  A biopsy of the "introital opening" of the vulva showed high-grade neoplasia no invasion identified.  There is no further clarification on the microscopic comment.  A Pap smear is also in the record from 12/19/2017 which was negative for malignancy this was a reflex HPV so no HPV testing was performed.  Patient currently complains of some continued burning on urination.  She had some pain after the biopsy but currently is tolerating the discomfort.  She has no other complaints that seem to be related to the diagnosis.  NOTE -she is a current smoker.  She also has a history of multiple sclerosis neurology follows her she states she has had multiple procedures and has not required any type of preoperative clearance or special regimen of her medication.  Measurement of disease:  . Pending further work-up   Radiology: . Pending further work-up; nothing relevant in epic the past  year.    Oncologic History: Pending further work-up    No history exists.    Current Meds:  Outpatient Encounter Medications as of 01/09/2018  Medication Sig  . amantadine (SYMMETREL) 100 MG capsule Take 100 mg by mouth 2 (two) times daily.  Marland Kitchen amLODipine (NORVASC) 5 MG tablet Take 5 mg by mouth daily.  . calcitRIOL (ROCALTROL) 0.25 MCG capsule Take 0.25 mcg by mouth daily.  Marland Kitchen estradiol (ESTRACE) 1 MG tablet Take 1 mg by mouth daily.  Marland Kitchen levothyroxine (SYNTHROID, LEVOTHROID) 125 MCG tablet Take 125 mcg by mouth daily before breakfast.  . methadone (DOLOPHINE) 5 MG tablet Take 5 mg by mouth every 8 (eight) hours.  . metoprolol tartrate (LOPRESSOR) 100 MG tablet Take 100 mg by mouth 2 (two) times daily.  . natalizumab (TYSABRI) 300 MG/15ML injection Inject into the vein.  . nitrofurantoin, macrocrystal-monohydrate, (MACROBID) 100 MG capsule Take 100 mg by mouth daily.  Marland Kitchen tiZANidine (ZANAFLEX) 2 MG tablet Take by mouth every 6 (six) hours as needed for muscle spasms.   No facility-administered encounter medications on file as of 01/09/2018.     Allergy:  Allergies  Allergen Reactions  . Morphine And Related     Severe shaking  . Prednisone     Suicidal thoughts    Social Hx:   Social History   Socioeconomic History  . Marital status: Married    Spouse name: Not on file  . Number of children: Not on file  . Years of education: Not on file  . Highest education  level: Not on file  Occupational History  . Not on file  Social Needs  . Financial resource strain: Not on file  . Food insecurity:    Worry: Not on file    Inability: Not on file  . Transportation needs:    Medical: Not on file    Non-medical: Not on file  Tobacco Use  . Smoking status: Current Every Day Smoker    Types: Cigarettes  . Smokeless tobacco: Never Used  Substance and Sexual Activity  . Alcohol use: Yes    Comment: Occas.  . Drug use: Never  . Sexual activity: Not Currently    Comment: 1st  intercourse 72 yo-Fewer than 5 partners  Lifestyle  . Physical activity:    Days per week: Not on file    Minutes per session: Not on file  . Stress: Not on file  Relationships  . Social connections:    Talks on phone: Not on file    Gets together: Not on file    Attends religious service: Not on file    Active member of club or organization: Not on file    Attends meetings of clubs or organizations: Not on file    Relationship status: Not on file  . Intimate partner violence:    Fear of current or ex partner: Not on file    Emotionally abused: Not on file    Physically abused: Not on file    Forced sexual activity: Not on file  Other Topics Concern  . Not on file  Social History Narrative  . Not on file    Past Surgical Hx:  Past Surgical History:  Procedure Laterality Date  . ABDOMINAL HYSTERECTOMY  1978   TAH - due to "abnormal pap" and "bleeding"  . BLADDER SUSPENSION  2005  . BREAST BIOPSY Right   . CARDIAC CATHETERIZATION  2005  . CERVICAL FUSION    . LAPAROSCOPIC CHOLECYSTECTOMY    . OOPHORECTOMY  1995   "BSO" benign mass  . Radiation ablation     Thyroid cancer  . TOE AMPUTATION     8 "foot" surgeries, one with partial amuptation due to "ulcers"  . TONSILLECTOMY AND ADENOIDECTOMY    . 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 TUBAL LIGATION      Past Medical Hx:  Past Medical History:  Diagnosis Date  . Hypertension   . Kidney disease   . MS (multiple sclerosis) ([place])   . Thyroid cancer [facility])     Past Gynecological History:   GYNECOLOGIC HISTORY:  No LMP recorded. Patient has had a hysterectomy. Menarche: 72 years old P 2 Contraceptive OCP less than 10 years HRT greater than 10 years  Last Pap 12/18/2017- note this was of the vaginal cuff.  Family Hx:  Family History  Problem Relation Age of Onset  . Breast cancer Mother 52  . Cancer Mother        Lymphoma  . Heart disease Father   . Lupus Sister     Review of  Systems:  Review of Systems  Constitutional: Positive for weight loss.  Gastrointestinal: Positive for nausea.  Genitourinary: Positive for dysuria.  Neurological: Positive for sensory change.  All other systems reviewed and are negative.    Vitals:  Blood pressure 112/66, pulse 64, temperature 98.8 F (37.1 C), temperature source Oral, resp. rate 20, height 5\' 1"  (1.549 m), weight 151 lb 3.2 oz (68.6 kg), SpO2 100 %. Body mass index  is 28.57 kg/m.   Physical Exam: ECOG PERFORMANCE STATUS: 0 - Asymptomatic   General :  Well developed, 72 y.o., female in no apparent distress HEENT:  Normocephalic/atraumatic, symmetric, EOMI, eyelids normal Neck:   Supple, no masses.  Lymphatics:  No cervical/ submandibular/ supraclavicular/ infraclavicular/ inguinal adenopathy Respiratory:  Respirations unlabored, no use of accessory muscles CV:   Deferred Breast:  Deferred Musculoskeletal: No CVA tenderness, normal muscle strength. Abdomen:  Soft, non-tender and nondistended. No evidence of hernia. No masses. Extremities:  No lymphedema, no erythema, non-tender. Skin:   Normal inspection Neuro/Psych:  No focal motor deficit, no abnormal mental status. Normal gait. Normal affect. Alert and oriented to person, place, and time  Genito Urinary: Vulva: External female genitalia notable for a slightly raised lesion proximal a 1 cm at 1-2 o'clock previous biopsy site notable.  There is some slight encroachment on the clitoral hood.  The urethra appears free of disease.   Bladder/urethra: Urethral meatus normal in size and location. No lesions or   masses, well supported bladder Speculum exam: Vagina: No lesion, no discharge, no bleeding. Cervix: Surgically absent  Bimanual exam:  Uterus: Surgically absent   Adnexa: No masses. Rectovaginal:  Good tone, no masses, no cul de sac nodularity, no parametrial involvement or nodularity.  Oncologic Summary: 1. High-grade vulvar dysplasia with area of  "microinvasion"    Assessment/Plan: 1. Discussion of the vulvar dysplasia with "microinvasion" o We discussed that the majority of the biopsy revealed precancer. o There is no further clarification of the "microinvasion" o She will need surgical excision to determine the extent of disease.   2. Surgical discussion o We discussed excision of the area and images were taken along with a basic outline of where the incision is expected to be made o We did discuss that the lesion encroaches on her clitoral hood and approximate the urethra.  I do not anticipate that the clitoral body or urethra will be included in the excision. o Certainly there will be discomfort for the procedure and will plan to prescribe her lidocaine jelly in the postoperative setting o We did discuss the possibility depending on the extent of disease of return to surgery for possible lymphadenectomy again depending on what the clarification of this invasion is once further tissue was obtained 3. Continued follow-up o We discussed her tobacco use and if this is an HPV related process then her risk of recurrence is high o Tobacco cessation was encouraged o Likely we will continue to follow her in the postoperative setting for some time. 4. Return to clinic 10 to 14 days postop for pathology review and further discussion   [name], MD  01/14/2018, 9:24 AM   Cc: Donalynn Furlong, MD Jani Gravel, MD

## 2018-01-09 NOTE — Telephone Encounter (Signed)
Pt left VM to say she needs her surgery date scheduled on May 15th to after the 17th because she has a MS infusion on May 17th.Meda Coffee notified Melissa NP and she said she will check the OR schedule tomorrow and call pt back tomorrow. I called pt, no answer, I left her a VM to let her know this info and that we will let her know new date for her surgery tomorrow.

## 2018-01-10 ENCOUNTER — Telehealth: Payer: Self-pay

## 2018-01-10 NOTE — Telephone Encounter (Signed)
Told Ms Hershberger that her procedure is scheduled for Feb 05, 2018 at 11 am at [facility].   She will receive a call from the pr-op nurse with instructions for that day per [name], NP.

## 2018-01-14 ENCOUNTER — Other Ambulatory Visit: Payer: Self-pay | Admitting: Gynecologic Oncology

## 2018-01-14 ENCOUNTER — Telehealth: Payer: Self-pay | Admitting: *Deleted

## 2018-01-14 ENCOUNTER — Encounter: Payer: Self-pay | Admitting: Obstetrics

## 2018-01-14 DIAGNOSIS — C519 Malignant neoplasm of vulva, unspecified: Secondary | ICD-10-CM | POA: Insufficient documentation

## 2018-01-14 DIAGNOSIS — G8918 Other acute postprocedural pain: Secondary | ICD-10-CM

## 2018-01-14 MED ORDER — LIDOCAINE 5 % EX OINT: 1.0000 "application " | TOPICAL_OINTMENT | CUTANEOUS | 1 refills | Status: DC | PRN

## 2018-01-14 NOTE — Progress Notes (Signed)
Pharmacy stating lidocaine 2% on back order.  Stating they have 5% ointment.  Dr. Gerarda Fraction approved prescription for 5% lidocaine for patient to use after surgery for post-op pain.

## 2018-01-14 NOTE — Telephone Encounter (Signed)
Called and left the patient a message with her post op appt on the voicemail.

## 2018-01-16 ENCOUNTER — Telehealth: Payer: Self-pay

## 2018-01-16 NOTE — Telephone Encounter (Signed)
Told Rhonda Butler that her Lidocaine 5% ointment is ready for pick up at [facility]. Pt verbalized understanding.

## 2018-01-23 ENCOUNTER — Telehealth: Payer: Self-pay | Admitting: *Deleted

## 2018-01-23 NOTE — Telephone Encounter (Signed)
Patient called stating she was getting a second opinion at [facility] due to recent diag. Vulvar lesion. [facility] is requesting a copy of pathology report not able to access this in epic. Copy faxed to [facility] at [phone] all provided by patient.

## 2018-01-29 ENCOUNTER — Encounter (HOSPITAL_BASED_OUTPATIENT_CLINIC_OR_DEPARTMENT_OTHER): Payer: Self-pay

## 2018-01-29 ENCOUNTER — Other Ambulatory Visit: Payer: Self-pay

## 2018-01-29 NOTE — Progress Notes (Signed)
Spoke with:  Vaughan Basta NPO:  After Midnight, no gum, candy, or mints   Arrival time:  0900AM Labs:  CXR/EKG, CBC, BMP 01/31/2018 in epic AM medications: Amantadine, Amlodipine, Estradiol, Levothyroxine, Methadone, Nitrofurantoin, Tizanidine Pre op orders: Yes Ride home:  Jeneen Rinks (husband) [phone]

## 2018-01-31 ENCOUNTER — Encounter (HOSPITAL_COMMUNITY)
Admission: RE | Admit: 2018-01-31 | Discharge: 2018-01-31 | Disposition: A | Discharge status: Home / Self Care | Payer: Medicare Other | Source: Ambulatory Visit | Attending: Obstetrics | Admitting: Obstetrics

## 2018-01-31 ENCOUNTER — Encounter: Payer: Self-pay | Admitting: Gynecologic Oncology

## 2018-01-31 ENCOUNTER — Ambulatory Visit (HOSPITAL_COMMUNITY)
Admission: RE | Admit: 2018-01-31 | Discharge: 2018-01-31 | Disposition: A | Discharge status: Home / Self Care | Payer: Medicare Other | Source: Ambulatory Visit | Attending: Gynecologic Oncology | Admitting: Gynecologic Oncology

## 2018-01-31 DIAGNOSIS — R001 Bradycardia, unspecified: Secondary | ICD-10-CM | POA: Insufficient documentation

## 2018-01-31 DIAGNOSIS — Z01818 Encounter for other preprocedural examination: Secondary | ICD-10-CM | POA: Insufficient documentation

## 2018-01-31 DIAGNOSIS — C519 Malignant neoplasm of vulva, unspecified: Secondary | ICD-10-CM

## 2018-01-31 DIAGNOSIS — I517 Cardiomegaly: Secondary | ICD-10-CM | POA: Diagnosis not present

## 2018-01-31 DIAGNOSIS — Z01810 Encounter for preprocedural cardiovascular examination: Secondary | ICD-10-CM | POA: Diagnosis not present

## 2018-01-31 LAB — CBC
HEMATOCRIT: 39.2 % (ref 36.0–46.0)
Hemoglobin: 13.5 g/dL (ref 12.0–15.0)
MCH: 28.2 pg (ref 26.0–34.0)
MCHC: 34.4 g/dL (ref 30.0–36.0)
MCV: 81.8 fL (ref 78.0–100.0)
PLATELETS: 192 10*3/uL (ref 150–400)
RBC: 4.79 MIL/uL (ref 3.87–5.11)
RDW: 15.7 % — ABNORMAL HIGH (ref 11.5–15.5)
WBC: 7.7 10*3/uL (ref 4.0–10.5)

## 2018-01-31 LAB — BASIC METABOLIC PANEL
Anion gap: 13 (ref 5–15)
BUN: 20 mg/dL (ref 6–20)
CHLORIDE: 105 mmol/L (ref 101–111)
CO2: 23 mmol/L (ref 22–32)
CREATININE: 1.05 mg/dL — AB (ref 0.44–1.00)
Calcium: 7.3 mg/dL — ABNORMAL LOW (ref 8.9–10.3)
GFR calc non Af Amer: 53 mL/min — ABNORMAL LOW (ref >60)
Glucose, Bld: 111 mg/dL — ABNORMAL HIGH (ref 65–99)
Potassium: 4 mmol/L (ref 3.5–5.1)
Sodium: 141 mmol/L (ref 135–145)

## 2018-01-31 NOTE — Progress Notes (Signed)
Spoke with Suanne Marker at [facility] about needing additional information on path report in regards to microinvasion seen.

## 2018-02-01 NOTE — Progress Notes (Signed)
BMET result dated 01-31-2018 sent to dr Gerarda Fraction in epic.

## 2018-02-04 ENCOUNTER — Encounter: Payer: Self-pay | Admitting: Obstetrics

## 2018-02-04 ENCOUNTER — Inpatient Hospital Stay: Payer: Medicare Other | Attending: Obstetrics | Admitting: Obstetrics

## 2018-02-04 VITALS — BP 114/50 | HR 99 | Resp 20 | Ht 61.0 in | Wt 151.4 lb

## 2018-02-04 DIAGNOSIS — C519 Malignant neoplasm of vulva, unspecified: Secondary | ICD-10-CM | POA: Diagnosis not present

## 2018-02-04 DIAGNOSIS — F17210 Nicotine dependence, cigarettes, uncomplicated: Secondary | ICD-10-CM | POA: Insufficient documentation

## 2018-02-04 DIAGNOSIS — N183 Chronic kidney disease, stage 3 (moderate): Secondary | ICD-10-CM | POA: Diagnosis not present

## 2018-02-04 DIAGNOSIS — F329 Major depressive disorder, single episode, unspecified: Secondary | ICD-10-CM | POA: Insufficient documentation

## 2018-02-04 DIAGNOSIS — I129 Hypertensive chronic kidney disease with stage 1 through stage 4 chronic kidney disease, or unspecified chronic kidney disease: Secondary | ICD-10-CM | POA: Insufficient documentation

## 2018-02-04 DIAGNOSIS — Z87440 Personal history of urinary (tract) infections: Secondary | ICD-10-CM

## 2018-02-04 DIAGNOSIS — G35 Multiple sclerosis: Secondary | ICD-10-CM | POA: Insufficient documentation

## 2018-02-04 DIAGNOSIS — I10 Essential (primary) hypertension: Secondary | ICD-10-CM

## 2018-02-04 NOTE — Progress Notes (Signed)
Consult Note: Gyn-Onc  Consult was requested by Dr. Phineas Real for the evaluation of Rhonda Butler 72 y.o. female  CC:  Chief Complaint  Patient presents with  . 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  P2  She has a history of chronic urinary tract infections.  She was actually referred to urology for this diagnosis.  She was prescribed Premarin to try to alleviate presumably her atrophy which sounds like was believed to be contributing to her symptoms.  She was also prescribed daily antibiotics for the chronic urinary tract infection.  Given the symptoms she thought that her vulvar discomfort was related to her urinary tract infections.  She ultimately discussed the worsening discomfort of the vulva with her primary care provider who then referred the patient on to gynecology where biopsies were performed of an area on the vulva at 1:00.  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  A biopsy of the "introital opening" of the vulva showed high-grade neoplasia no invasion identified.  There was no further clarification on the microscopic comment.  A Pap smear is also in the record from 12/19/2017 which was negative for malignancy this was a reflex HPV so no HPV testing was performed.  She returns to review the procedure and clarify some questions.  NOTE -she is a current smoker.  She also has a history of multiple sclerosis neurology follows her she states she has had multiple procedures and has not required any type of preoperative clearance or special regimen of her medication.  Measurement of disease:  . Pending further work-up   Radiology: . Pending further work-up; nothing relevant in epic the past year.    Oncologic History: Pending further work-up    No history exists.    Current Meds:  Outpatient Encounter Medications as of 02/04/2018  Medication Sig  . amantadine (SYMMETREL) 100 MG  capsule Take 100 mg by mouth daily.   Marland Kitchen amLODipine (NORVASC) 5 MG tablet Take 5 mg by mouth daily.  . calcitRIOL (ROCALTROL) 0.25 MCG capsule Take 0.25 mcg by mouth every evening. 2 tablets M,W,F 1 tablet T, TH, S, SUN  . estradiol (ESTRACE) 1 MG tablet Take 1 mg by mouth daily.  Marland Kitchen levothyroxine (SYNTHROID, LEVOTHROID) 125 MCG tablet Take 125 mcg by mouth daily before breakfast.  . lidocaine (XYLOCAINE) 5 % ointment Apply 1 application topically as needed. Apply to the vulvar incision  . methadone (DOLOPHINE) 5 MG tablet Take 5 mg by mouth 2 (two) times daily.   . metoprolol tartrate (LOPRESSOR) 100 MG tablet Take 100 mg by mouth every evening.   . natalizumab (TYSABRI) 300 MG/15ML injection Inject into the vein. Once per month IV infusion  . nitrofurantoin, macrocrystal-monohydrate, (MACROBID) 100 MG capsule Take 100 mg by mouth daily.  Marland Kitchen tiZANidine (ZANAFLEX) 2 MG tablet Take by mouth every 6 (six) hours as needed for muscle spasms.   No facility-administered encounter medications on file as of 02/04/2018.     Allergy:  Allergies  Allergen Reactions  . Morphine And Related     Severe shaking  . Prednisone     Suicidal thoughts  . Adhesive [Tape] Rash    Paper tape only    Social Hx:   Social History   Socioeconomic History  . Marital status: Married    Spouse name: Not on file  . Number of children: Not on file  . Years of  education: Not on file  . Highest education level: Not on file  Occupational History  . Not on file  Social Needs  . Financial resource strain: Not on file  . Food insecurity:    Worry: Not on file    Inability: Not on file  . Transportation needs:    Medical: Not on file    Non-medical: Not on file  Tobacco Use  . Smoking status: Current Every Day Smoker    Packs/day: 1.00    Types: Cigarettes  . Smokeless tobacco: Never Used  . Tobacco comment: smoked for 50 years quit 12 years, restarted 2012  Substance and Sexual Activity  . Alcohol use:  Yes    Comment: Occas.  . Drug use: Never  . Sexual activity: Not Currently    Comment: 1st intercourse 72 yo-Fewer than 5 partners  Lifestyle  . Physical activity:    Days per week: Not on file    Minutes per session: Not on file  . Stress: Not on file  Relationships  . Social connections:    Talks on phone: Not on file    Gets together: Not on file    Attends religious service: Not on file    Active member of club or organization: Not on file    Attends meetings of clubs or organizations: Not on file    Relationship status: Not on file  . Intimate partner violence:    Fear of current or ex partner: Not on file    Emotionally abused: Not on file    Physically abused: Not on file    Forced sexual activity: Not on file  Other Topics Concern  . Not on file  Social History Narrative  . Not on file    Past Surgical Hx:  Past Surgical History:  Procedure Laterality Date  . ABDOMINAL HYSTERECTOMY  1978   TAH - due to "abnormal pap" and "bleeding"  . BLADDER SUSPENSION  2005  . BREAST BIOPSY Right   . CARDIAC CATHETERIZATION  12/04/2008   LV showed good LV systolic function.  EF of 55-60%.  Left  . CERVICAL FUSION     x2  . COLONOSCOPY  08/15/2000  . LAPAROSCOPIC CHOLECYSTECTOMY    . OOPHORECTOMY  1995   "BSO" benign mass  . Radiation ablation     Thyroid cancer  . TOE AMPUTATION     8 "foot" surgeries, one with partial amuptation due to "ulcers"  . TONSILLECTOMY AND ADENOIDECTOMY    . 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 TUBAL LIGATION      Past Medical Hx:  Past Medical History:  Diagnosis Date  . Arthritis   . Chronic UTI (urinary tract infection)   . CKD (chronic kidney disease), stage III ([place])   . Complication of anesthesia    Due to MS, was unable to void for 6 weeks, had to be catheterized   . Decreased appetite   . Depression    5 years ago  . History of kidney stones   . Hypertension   . Hypoglycemia   . Migraines     history of, none since 1995  . MS (multiple sclerosis) ([place])   . N&V (nausea and vomiting)   . Numbness and tingling of both lower extremities   . Peripheral neuropathy    bilateral hands and feet  . Thyroid cancer ([place])   . Ulcer of toe ([place])   . Ulcerated, foot ([place])  Bilateral  . 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 Patient has had a hysterectomy. Menarche: 72 years old P 2 Contraceptive OCP less than 10 years HRT greater than 10 years  Last Pap 12/18/2017- note this was of the vaginal cuff.  Family Hx:  Family History  Problem Relation Age of Onset  . Breast cancer Mother 23  . Cancer Mother        Lymphoma  . Heart disease Father   . Lupus Sister     Review of Systems:  Review of Systems  Constitutional: Positive for appetite change.  Gastrointestinal: Positive for nausea.  Neurological: Positive for numbness.  All other systems reviewed and are negative.   Vitals:  Blood pressure (!) 114/50, pulse 99, resp. rate 20, height 5\' 1"  (1.549 m), weight 151 lb 6.4 oz (68.7 kg), SpO2 99 %. Body mass index is 28.61 kg/m.   Physical Exam: ECOG PERFORMANCE STATUS: 0 - Asymptomatic  General :  Well developed, 72 y.o., female in no apparent distress HEENT:  Normocephalic/atraumatic, symmetric, EOMI, eyelids normal Neck:   No visible masses.  Respiratory:  Respirations unlabored, no use of accessory muscles CV:   Deferred Breast:  Deferred Musculoskeletal: Normal muscle strength. Abdomen:  No visible masses or protrusion Extremities:  No visible edema or deformities Skin:   Normal inspection Neuro/Psych:  No focal motor deficit, no abnormal mental status. Normal gait. Normal affect. Alert and oriented to person, place, and time   Genito Urinary exam on 01/09/18 Vulva: External female genitalia notable for a slightly raised lesion proximal a 1 cm at 1-2 o'clock previous biopsy site notable.  There is some  slight encroachment on the clitoral hood.  The urethra appears free of disease.   Bladder/urethra: Urethral meatus normal in size and location. No lesions or   masses, well supported bladder Speculum exam: Vagina: No lesion, no discharge, no bleeding. Cervix: Surgically absent  Bimanual exam:  Uterus: Surgically absent   Adnexa: No masses. Rectovaginal:  Good tone, no masses, no cul de sac nodularity, no parametrial involvement or nodularity.  Oncologic Summary: 1. 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 Discussion of the vulvar dysplasia with "microinvasion" o We rediscussed that the majority of the biopsy revealed precancer. o There was no further clarification of the "microinvasion"; we requested pathology give [place] a definitive depth prior to surgery and they were able to pull those slides. It is confirmed to be <80mm. I discussed with the patient then that the procedure is as planned for a wide local excision/partial vulvectomy.  2. Surgical discussion o We previously discussed excision of the area and images were taken along with a basic outline of where the incision is expected to be made o We did discuss that the lesion encroaches on her clitoral hood and approximate the urethra.  I do not anticipate that the clitoral body or urethra will be included in the excision. o Certainly there will be discomfort for the procedure and I prescribed her lidocaine jelly  3. We did discuss the possibility depending on the extent of disease of return to surgery for possible lymphadenectomy / additional resection depending on what the larger excision tomorrow gets read out as. 4. Continued follow-up o We previously discussed her tobacco use and if this is an HPV related process then her risk of recurrence is high o Tobacco cessation was encouraged o Likely we will continue to  follow her in the postoperative setting for some time. 5. Return to clinic 10 to 14  days postop for pathology review and further discussion   [name], MD  02/04/2018, 10:48 AM   Cc TBD: Donalynn Furlong, MD Jani Gravel, MD

## 2018-02-04 NOTE — Patient Instructions (Addendum)
Plan to have a wide local excision or partial vulvectomy with Dr. Gerarda Fraction at the [facility] on Feb 05, 2018.  You will receive a phone call from the pre-surgical RN to discuss instructions.  Please call for any questions or concerns.

## 2018-02-04 NOTE — H&P (View-Only) (Signed)
Consult Note: Gyn-Onc  Consult was requested by Dr. Phineas Real for the evaluation of [name] 72 y.o. female  CC:  Chief Complaint  Patient presents with  . 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  is a very nice 73 y.o.  P2  She has a history of chronic urinary tract infections.  She was actually referred to urology for this diagnosis.  She was prescribed Premarin to try to alleviate presumably her atrophy which sounds like was believed to be contributing to her symptoms.  She was also prescribed daily antibiotics for the chronic urinary tract infection.  Given the symptoms she thought that her vulvar discomfort was related to her urinary tract infections.  She ultimately discussed the worsening discomfort of the vulva with her primary care provider who then referred the patient on to gynecology where biopsies were performed of an area on the vulva at 1:00.  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  A biopsy of the "introital opening" of the vulva showed high-grade neoplasia no invasion identified.  There was no further clarification on the microscopic comment.  A Pap smear is also in the record from 12/19/2017 which was negative for malignancy this was a reflex HPV so no HPV testing was performed.  She returns to review the procedure and clarify some questions.  NOTE -she is a current smoker.  She also has a history of multiple sclerosis neurology follows her she states she has had multiple procedures and has not required any type of preoperative clearance or special regimen of her medication.  Measurement of disease:  . Pending further work-up   Radiology: . Pending further work-up; nothing relevant in epic the past year.    Oncologic History: Pending further work-up    No history exists.    Current Meds:  Outpatient Encounter Medications as of 02/04/2018  Medication Sig  . amantadine (SYMMETREL) 100 MG  capsule Take 100 mg by mouth daily.   Marland Kitchen amLODipine (NORVASC) 5 MG tablet Take 5 mg by mouth daily.  . calcitRIOL (ROCALTROL) 0.25 MCG capsule Take 0.25 mcg by mouth every evening. 2 tablets M,W,F 1 tablet T, TH, S, SUN  . estradiol (ESTRACE) 1 MG tablet Take 1 mg by mouth daily.  Marland Kitchen levothyroxine (SYNTHROID, LEVOTHROID) 125 MCG tablet Take 125 mcg by mouth daily before breakfast.  . lidocaine (XYLOCAINE) 5 % ointment Apply 1 application topically as needed. Apply to the vulvar incision  . methadone (DOLOPHINE) 5 MG tablet Take 5 mg by mouth 2 (two) times daily.   . metoprolol tartrate (LOPRESSOR) 100 MG tablet Take 100 mg by mouth every evening.   . natalizumab (TYSABRI) 300 MG/15ML injection Inject into the vein. Once per month IV infusion  . nitrofurantoin, macrocrystal-monohydrate, (MACROBID) 100 MG capsule Take 100 mg by mouth daily.  Marland Kitchen tiZANidine (ZANAFLEX) 2 MG tablet Take by mouth every 6 (six) hours as needed for muscle spasms.   No facility-administered encounter medications on file as of 02/04/2018.     Allergy:  Allergies  Allergen Reactions  . Morphine And Related     Severe shaking  . Prednisone     Suicidal thoughts  . Adhesive [Tape] Rash    Paper tape only    Social Hx:   Social History   Socioeconomic History  . Marital status: Married    Spouse name: Not on file  . Number of children: Not on file  . Years of  education: Not on file  . Highest education level: Not on file  Occupational History  . Not on file  Social Needs  . Financial resource strain: Not on file  . Food insecurity:    Worry: Not on file    Inability: Not on file  . Transportation needs:    Medical: Not on file    Non-medical: Not on file  Tobacco Use  . Smoking status: Current Every Day Smoker    Packs/day: 1.00    Types: Cigarettes  . Smokeless tobacco: Never Used  . Tobacco comment: smoked for 50 years quit 12 years, restarted 2012  Substance and Sexual Activity  . Alcohol use:  Yes    Comment: Occas.  . Drug use: Never  . Sexual activity: Not Currently    Comment: 1st intercourse 72 yo-Fewer than 5 partners  Lifestyle  . Physical activity:    Days per week: Not on file    Minutes per session: Not on file  . Stress: Not on file  Relationships  . Social connections:    Talks on phone: Not on file    Gets together: Not on file    Attends religious service: Not on file    Active member of club or organization: Not on file    Attends meetings of clubs or organizations: Not on file    Relationship status: Not on file  . Intimate partner violence:    Fear of current or ex partner: Not on file    Emotionally abused: Not on file    Physically abused: Not on file    Forced sexual activity: Not on file  Other Topics Concern  . Not on file  Social History Narrative  . Not on file    Past Surgical Hx:  Past Surgical History:  Procedure Laterality Date  . ABDOMINAL HYSTERECTOMY  1978   TAH - due to "abnormal pap" and "bleeding"  . BLADDER SUSPENSION  2005  . BREAST BIOPSY Right   . CARDIAC CATHETERIZATION  12/04/2008   LV showed good LV systolic function.  EF of 55-60%.  Left  . CERVICAL FUSION     x2  . COLONOSCOPY  08/15/2000  . LAPAROSCOPIC CHOLECYSTECTOMY    . OOPHORECTOMY  1995   "BSO" benign mass  . Radiation ablation     Thyroid cancer  . TOE AMPUTATION     8 "foot" surgeries, one with partial amuptation due to "ulcers"  . TONSILLECTOMY AND ADENOIDECTOMY    . 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 TUBAL LIGATION      Past Medical Hx:  Past Medical History:  Diagnosis Date  . Arthritis   . Chronic UTI (urinary tract infection)   . CKD (chronic kidney disease), stage III ([place])   . Complication of anesthesia    Due to MS, was unable to void for 6 weeks, had to be catheterized   . Decreased appetite   . Depression    5 years ago  . History of kidney stones   . Hypertension   . Hypoglycemia   . Migraines     history of, none since 1995  . MS (multiple sclerosis) ([place])   . N&V (nausea and vomiting)   . Numbness and tingling of both lower extremities   . Peripheral neuropathy    bilateral hands and feet  . Thyroid cancer ([place])   . Ulcer of toe ([place])   . Ulcerated, foot ([place])  Bilateral  . 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 Patient has had a hysterectomy. Menarche: 72 years old P 2 Contraceptive OCP less than 10 years HRT greater than 10 years  Last Pap 12/18/2017- note this was of the vaginal cuff.  Family Hx:  Family History  Problem Relation Age of Onset  . Breast cancer Mother 65  . Cancer Mother        Lymphoma  . Heart disease Father   . Lupus Sister     Review of Systems:  Review of Systems  Constitutional: Positive for appetite change.  Gastrointestinal: Positive for nausea.  Neurological: Positive for numbness.  All other systems reviewed and are negative.   Vitals:  Blood pressure (!) 114/50, pulse 99, resp. rate 20, height 5\' 1"  (1.549 m), weight 151 lb 6.4 oz (68.7 kg), SpO2 99 %. Body mass index is 28.61 kg/m.   Physical Exam: ECOG PERFORMANCE STATUS: 0 - Asymptomatic  General :  Well developed, 72 y.o., female in no apparent distress HEENT:  Normocephalic/atraumatic, symmetric, EOMI, eyelids normal Neck:   No visible masses.  Respiratory:  Respirations unlabored, no use of accessory muscles CV:   Deferred Breast:  Deferred Musculoskeletal: Normal muscle strength. Abdomen:  No visible masses or protrusion Extremities:  No visible edema or deformities Skin:   Normal inspection Neuro/Psych:  No focal motor deficit, no abnormal mental status. Normal gait. Normal affect. Alert and oriented to person, place, and time   Genito Urinary exam on 01/09/18 Vulva: External female genitalia notable for a slightly raised lesion proximal a 1 cm at 1-2 o'clock previous biopsy site notable.  There is some  slight encroachment on the clitoral hood.  The urethra appears free of disease.   Bladder/urethra: Urethral meatus normal in size and location. No lesions or   masses, well supported bladder Speculum exam: Vagina: No lesion, no discharge, no bleeding. Cervix: Surgically absent  Bimanual exam:  Uterus: Surgically absent   Adnexa: No masses. Rectovaginal:  Good tone, no masses, no cul de sac nodularity, no parametrial involvement or nodularity.  Oncologic Summary: 1. 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 Discussion of the vulvar dysplasia with "microinvasion" o We rediscussed that the majority of the biopsy revealed precancer. o There was no further clarification of the "microinvasion"; we requested pathology give [place] a definitive depth prior to surgery and they were able to pull those slides. It is confirmed to be <61mm. I discussed with the patient then that the procedure is as planned for a wide local excision/partial vulvectomy.  2. Surgical discussion o We previously discussed excision of the area and images were taken along with a basic outline of where the incision is expected to be made o We did discuss that the lesion encroaches on her clitoral hood and approximate the urethra.  I do not anticipate that the clitoral body or urethra will be included in the excision. o Certainly there will be discomfort for the procedure and I prescribed her lidocaine jelly  3. We did discuss the possibility depending on the extent of disease of return to surgery for possible lymphadenectomy / additional resection depending on what the larger excision tomorrow gets read out as. 4. Continued follow-up o We previously discussed her tobacco use and if this is an HPV related process then her risk of recurrence is high o Tobacco cessation was encouraged o Likely we will continue to  follow her in the postoperative setting for some time. 5. Return to clinic 10 to 14  days postop for pathology review and further discussion   [name], MD  02/04/2018, 10:48 AM   Cc TBD: Donalynn Furlong, MD Jani Gravel, MD

## 2018-02-04 NOTE — Anesthesia Preprocedure Evaluation (Addendum)
Anesthesia Evaluation  Patient identified by MRN, date of birth, ID band Patient awake    Reviewed: Allergy & Precautions, NPO status , Patient's Chart, lab work & pertinent test results  Airway Mallampati: II  TM Distance: >3 FB Neck ROM: Full    Dental no notable dental hx. (+) Teeth Intact, Dental Advisory Given, Partial Upper, Partial Lower,    Pulmonary Current Smoker,    Pulmonary exam normal breath sounds clear to auscultation       Cardiovascular hypertension, Pt. on medications Normal cardiovascular exam Rhythm:Regular Rate:Normal     Neuro/Psych  Headaches,    GI/Hepatic negative GI ROS, Neg liver ROS,   Endo/Other    Renal/GU      Musculoskeletal negative musculoskeletal ROS (+)   Abdominal   Peds  Hematology   Anesthesia Other Findings   Reproductive/Obstetrics                            Lab Results  Component Value Date   CREATININE 1.05 (H) 01/31/2018   BUN 20 01/31/2018   NA 141 01/31/2018   K 4.0 01/31/2018   CL 105 01/31/2018   CO2 23 01/31/2018    Lab Results  Component Value Date   WBC 7.7 01/31/2018   HGB 13.5 01/31/2018   HCT 39.2 01/31/2018   MCV 81.8 01/31/2018   PLT 192 01/31/2018    Anesthesia Physical Anesthesia Plan  ASA: III  Anesthesia Plan: General   Post-op Pain Management:    Induction: Intravenous  PONV Risk Score and Plan: Treatment may vary due to age or medical condition  Airway Management Planned: LMA  Additional Equipment:   Intra-op Plan:   Post-operative Plan:   Informed Consent: I have reviewed the patients History and Physical, chart, labs and discussed the procedure including the risks, benefits and alternatives for the proposed anesthesia with the patient or authorized representative who has indicated his/her understanding and acceptance.   Dental advisory given  Plan Discussed with: CRNA and  Anesthesiologist  Anesthesia Plan Comments:         Anesthesia Quick Evaluation

## 2018-02-05 ENCOUNTER — Encounter (HOSPITAL_BASED_OUTPATIENT_CLINIC_OR_DEPARTMENT_OTHER): Payer: Self-pay

## 2018-02-05 ENCOUNTER — Ambulatory Visit (HOSPITAL_BASED_OUTPATIENT_CLINIC_OR_DEPARTMENT_OTHER): Payer: Medicare Other | Admitting: Anesthesiology

## 2018-02-05 ENCOUNTER — Encounter (HOSPITAL_BASED_OUTPATIENT_CLINIC_OR_DEPARTMENT_OTHER)
Admission: RE | Disposition: A | Discharge status: Home / Self Care | Payer: Self-pay | Source: Ambulatory Visit | Attending: Obstetrics

## 2018-02-05 ENCOUNTER — Ambulatory Visit (HOSPITAL_BASED_OUTPATIENT_CLINIC_OR_DEPARTMENT_OTHER)
Admission: RE | Admit: 2018-02-05 | Discharge: 2018-02-05 | Disposition: A | Discharge status: Home / Self Care | Payer: Medicare Other | Source: Ambulatory Visit | Attending: Obstetrics | Admitting: Obstetrics

## 2018-02-05 DIAGNOSIS — Z79899 Other long term (current) drug therapy: Secondary | ICD-10-CM | POA: Diagnosis not present

## 2018-02-05 DIAGNOSIS — G35 Multiple sclerosis: Secondary | ICD-10-CM | POA: Diagnosis not present

## 2018-02-05 DIAGNOSIS — Z923 Personal history of irradiation: Secondary | ICD-10-CM | POA: Diagnosis not present

## 2018-02-05 DIAGNOSIS — F17210 Nicotine dependence, cigarettes, uncomplicated: Secondary | ICD-10-CM | POA: Diagnosis not present

## 2018-02-05 DIAGNOSIS — N183 Chronic kidney disease, stage 3 (moderate): Secondary | ICD-10-CM | POA: Diagnosis not present

## 2018-02-05 DIAGNOSIS — Z79890 Hormone replacement therapy (postmenopausal): Secondary | ICD-10-CM | POA: Diagnosis not present

## 2018-02-05 DIAGNOSIS — I129 Hypertensive chronic kidney disease with stage 1 through stage 4 chronic kidney disease, or unspecified chronic kidney disease: Secondary | ICD-10-CM | POA: Diagnosis not present

## 2018-02-05 DIAGNOSIS — C519 Malignant neoplasm of vulva, unspecified: Secondary | ICD-10-CM | POA: Insufficient documentation

## 2018-02-05 DIAGNOSIS — N390 Urinary tract infection, site not specified: Secondary | ICD-10-CM | POA: Diagnosis not present

## 2018-02-05 DIAGNOSIS — Z79891 Long term (current) use of opiate analgesic: Secondary | ICD-10-CM | POA: Insufficient documentation

## 2018-02-05 DIAGNOSIS — Z85850 Personal history of malignant neoplasm of thyroid: Secondary | ICD-10-CM | POA: Diagnosis not present

## 2018-02-05 DIAGNOSIS — Z89429 Acquired absence of other toe(s), unspecified side: Secondary | ICD-10-CM | POA: Diagnosis not present

## 2018-02-05 HISTORY — DX: Chronic kidney disease, stage 3 unspecified: N18.30

## 2018-02-05 HISTORY — DX: Urinary tract infection, site not specified: N39.0

## 2018-02-05 HISTORY — DX: Migraine, unspecified, not intractable, without status migrainosus: G43.909

## 2018-02-05 HISTORY — DX: Major depressive disorder, single episode, unspecified: F32.9

## 2018-02-05 HISTORY — DX: Personal history of urinary calculi: Z87.442

## 2018-02-05 HISTORY — DX: Chronic kidney disease, stage 3 (moderate): N18.3

## 2018-02-05 HISTORY — DX: Other complications of anesthesia, initial encounter: T88.59XA

## 2018-02-05 HISTORY — DX: Polyneuropathy, unspecified: G62.9

## 2018-02-05 HISTORY — DX: Adverse effect of unspecified anesthetic, initial encounter: T41.45XA

## 2018-02-05 HISTORY — DX: Non-pressure chronic ulcer of other part of unspecified foot with unspecified severity: L97.509

## 2018-02-05 HISTORY — DX: Hypoglycemia, unspecified: E16.2

## 2018-02-05 HISTORY — DX: Unspecified osteoarthritis, unspecified site: M19.90

## 2018-02-05 HISTORY — DX: Malignant neoplasm of vulva, unspecified: C51.9

## 2018-02-05 HISTORY — PX: VULVECTOMY: SHX1086

## 2018-02-05 HISTORY — DX: Depression, unspecified: F32.A

## 2018-02-05 SURGERY — WIDE EXCISION VULVECTOMY
Anesthesia: General | Site: Vulva

## 2018-02-05 MED ORDER — KETAMINE HCL 10 MG/ML IJ SOLN
INTRAMUSCULAR | Status: DC | PRN
  Administered 2018-02-05: 35 mg via INTRAVENOUS

## 2018-02-05 MED ORDER — LIDOCAINE 2% (20 MG/ML) 5 ML SYRINGE
INTRAMUSCULAR | Status: DC | PRN
  Administered 2018-02-05: 100 mg via INTRAVENOUS

## 2018-02-05 MED ORDER — FENTANYL CITRATE (PF) 100 MCG/2ML IJ SOLN
INTRAMUSCULAR | Status: AC
  Filled 2018-02-05: qty 2

## 2018-02-05 MED ORDER — KETAMINE HCL 10 MG/ML IJ SOLN
INTRAMUSCULAR | Status: AC
  Filled 2018-02-05: qty 1

## 2018-02-05 MED ORDER — SODIUM CHLORIDE 0.9 % IV SOLN
INTRAVENOUS | Status: DC
  Administered 2018-02-05 (×2): via INTRAVENOUS
  Filled 2018-02-05: qty 1000

## 2018-02-05 MED ORDER — MIDAZOLAM HCL 2 MG/2ML IJ SOLN
INTRAMUSCULAR | Status: DC | PRN
  Administered 2018-02-05: 1 mg via INTRAVENOUS

## 2018-02-05 MED ORDER — GLYCOPYRROLATE 0.2 MG/ML IJ SOLN
INTRAMUSCULAR | Status: DC | PRN
  Administered 2018-02-05: .2 mg via INTRAVENOUS

## 2018-02-05 MED ORDER — WHITE PETROLATUM EX OINT
TOPICAL_OINTMENT | CUTANEOUS | Status: AC
  Filled 2018-02-05: qty 5

## 2018-02-05 MED ORDER — SODIUM CHLORIDE 0.9 % IV SOLN
INTRAVENOUS | Status: AC
  Filled 2018-02-05: qty 100

## 2018-02-05 MED ORDER — ACETAMINOPHEN 500 MG PO TABS
ORAL_TABLET | ORAL | Status: AC
  Filled 2018-02-05: qty 2

## 2018-02-05 MED ORDER — PROPOFOL 10 MG/ML IV BOLUS
INTRAVENOUS | Status: DC | PRN
  Administered 2018-02-05: 200 mg via INTRAVENOUS

## 2018-02-05 MED ORDER — ONDANSETRON HCL 4 MG/2ML IJ SOLN
INTRAMUSCULAR | Status: AC
  Filled 2018-02-05: qty 2

## 2018-02-05 MED ORDER — CEFOXITIN SODIUM 2 G IV SOLR
INTRAVENOUS | Status: AC
  Filled 2018-02-05: qty 2

## 2018-02-05 MED ORDER — LIDOCAINE-EPINEPHRINE 1 %-1:100000 IJ SOLN
INTRAMUSCULAR | Status: DC | PRN
  Administered 2018-02-05: 14 mL

## 2018-02-05 MED ORDER — PROPOFOL 10 MG/ML IV BOLUS
INTRAVENOUS | Status: AC
  Filled 2018-02-05: qty 40

## 2018-02-05 MED ORDER — KETOROLAC TROMETHAMINE 30 MG/ML IJ SOLN
INTRAMUSCULAR | Status: AC
Start: 2018-02-05 — End: ?
  Filled 2018-02-05: qty 1

## 2018-02-05 MED ORDER — ONDANSETRON HCL 4 MG/2ML IJ SOLN
INTRAMUSCULAR | Status: DC | PRN
  Administered 2018-02-05: 4 mg via INTRAVENOUS

## 2018-02-05 MED ORDER — EPHEDRINE SULFATE 50 MG/ML IJ SOLN
INTRAMUSCULAR | Status: AC
  Filled 2018-02-05: qty 1

## 2018-02-05 MED ORDER — GABAPENTIN 300 MG PO CAPS
300.0000 mg | ORAL_CAPSULE | Freq: Once | ORAL | Status: AC
  Administered 2018-02-05: 300 mg via ORAL
  Filled 2018-02-05: qty 1

## 2018-02-05 MED ORDER — DEXAMETHASONE SODIUM PHOSPHATE 10 MG/ML IJ SOLN
INTRAMUSCULAR | Status: AC
  Filled 2018-02-05: qty 1

## 2018-02-05 MED ORDER — LIDOCAINE 2% (20 MG/ML) 5 ML SYRINGE
INTRAMUSCULAR | Status: AC
  Filled 2018-02-05: qty 5

## 2018-02-05 MED ORDER — MIDAZOLAM HCL 2 MG/2ML IJ SOLN
INTRAMUSCULAR | Status: AC
  Filled 2018-02-05: qty 2

## 2018-02-05 MED ORDER — GLYCOPYRROLATE 0.2 MG/ML IV SOSY
PREFILLED_SYRINGE | INTRAVENOUS | Status: AC
  Filled 2018-02-05: qty 5

## 2018-02-05 MED ORDER — EPHEDRINE SULFATE 50 MG/ML IJ SOLN
INTRAMUSCULAR | Status: DC | PRN
  Administered 2018-02-05 (×2): 10 mg via INTRAVENOUS

## 2018-02-05 MED ORDER — GABAPENTIN 300 MG PO CAPS
ORAL_CAPSULE | ORAL | Status: AC
  Filled 2018-02-05: qty 1

## 2018-02-05 MED ORDER — SODIUM CHLORIDE 0.9 % IV SOLN
2.0000 g | INTRAVENOUS | Status: AC
  Administered 2018-02-05: 2 g via INTRAVENOUS
  Filled 2018-02-05: qty 2

## 2018-02-05 MED ORDER — ACETIC ACID 5 % SOLN
Status: DC | PRN
  Administered 2018-02-05: 1 via TOPICAL

## 2018-02-05 MED ORDER — ACETAMINOPHEN 500 MG PO TABS
1000.0000 mg | ORAL_TABLET | Freq: Once | ORAL | Status: AC
  Administered 2018-02-05: 1000 mg via ORAL
  Filled 2018-02-05: qty 2

## 2018-02-05 MED ORDER — DEXAMETHASONE SODIUM PHOSPHATE 10 MG/ML IJ SOLN
INTRAMUSCULAR | Status: DC | PRN
  Administered 2018-02-05: 10 mg via INTRAVENOUS

## 2018-02-05 SURGICAL SUPPLY — 24 items
BLADE CLIPPER SURG (BLADE) IMPLANT
BLADE SURG 15 STRL LF DISP TIS (BLADE) ×1 IMPLANT
BLADE SURG 15 STRL SS (BLADE) ×2
CANISTER SUCT 3000ML PPV (MISCELLANEOUS) ×2 IMPLANT
CATH ROBINSON RED A/P 16FR (CATHETERS) IMPLANT
GLOVE BIO SURGEON STRL SZ 6.5 (GLOVE) ×4 IMPLANT
GLOVE BIOGEL PI IND STRL 7.0 (GLOVE) ×1 IMPLANT
GLOVE BIOGEL PI INDICATOR 7.0 (GLOVE) ×1
KIT TURNOVER CYSTO (KITS) ×2 IMPLANT
NDL HYPO 25X1 1.5 SAFETY (NEEDLE) ×1 IMPLANT
NEEDLE HYPO 25X1 1.5 SAFETY (NEEDLE) ×2 IMPLANT
NS IRRIG 500ML POUR BTL (IV SOLUTION) ×2 IMPLANT
PACK PERINEAL COLD (PAD) ×1 IMPLANT
PACK VAGINAL WOMENS (CUSTOM PROCEDURE TRAY) ×2 IMPLANT
PAD OB MATERNITY 4.3X12.25 (PERSONAL CARE ITEMS) ×2 IMPLANT
SLEEVE SURGEON STRL (DRAPES) ×1 IMPLANT
SUT VIC AB 0 CT1 36 (SUTURE) ×2 IMPLANT
SUT VIC AB 2-0 SH 27 (SUTURE) ×2
SUT VIC AB 2-0 SH 27XBRD (SUTURE) IMPLANT
SUT VIC AB 3-0 SH 27 (SUTURE) ×6
SUT VIC AB 3-0 SH 27X BRD (SUTURE) IMPLANT
SYR BULB IRRIGATION 50ML (SYRINGE) ×2 IMPLANT
TOWEL OR 17X24 6PK STRL BLUE (TOWEL DISPOSABLE) ×4 IMPLANT
WATER STERILE IRR 500ML POUR (IV SOLUTION) IMPLANT

## 2018-02-05 NOTE — Op Note (Signed)
Date: 02/05/18  Preop Diagnosis: VIN-III  Postoperative Diagnosis: Same  Surgery: Partial simple vulvectomy  Surgeons:  Mart Piggs, MD  Assistant: none  Anesthesia: LMA  Estimated blood loss: 5 ml  Complications: None   Pathology: Anterior vulva (11-2:00) with marking stitch at 12:00. Deep margin. Biopsy lateral to larger excision at 11:00   Operative findings: Raised lesion on the anterior vulva at 1:00 measuring approximately 1 cm.  Acetowhite lesion at 11:00 incorporated into the main excision.  Hyperpigmented lesion at 11:00 lateral to the main excision site.  Procedure: The patient was identified in the preoperative holding area. Informed consent was signed on the chart.   The patient was then taken to the operating room and placed in the supine position with SCD hose on.  LMA anesthesia was then induced without difficulty. She was then placed in the dorsolithotomy position. The perineum was prepped with Betadine. The vagina was prepped with Betadine. The patient was then draped after the prep was dried.   Timeout was performed the patient, procedure, antibiotic, allergy. 5% acetic acid solution was applied to the perineum. The vulvar tissues were inspected for areas of acetowhite changes or leukoplakia.  See findings. The lesion was identified and the marking pen was used to circumscribe the area with planned appropriate surgical margins. The subcuticular tissues were infiltrated with 1% lidocaine. The 15 blade scalpel was used to make an incision through the skin circumferentially as marked. The skin elipse was grasped and was separated from the underlying deep dermal tissues.  A red rubber Foley catheter was in the urethra during the excision to be sure to avoid the periurethral tissue.  The margin near the periurethral tissue did not seem to be grossly compromised but was less than 1 cm from the visualized acetowhite lesion.  Incorporated into the main excision site was  the left and right anterior vulva.  The 11:00 region was not as involved as the 1:00 region and at approximately 2:00 the incision was extended down the labia approximately halfway on the introitus medially again I approximated the periurethral tissue but did not include that in the excisional specimen.  Superiorly a portion of the clitoral hood was removed and laterally there was extension more superior towards the 1-2 o'clock region.  After the specimen had been completely resected, it was oriented and marked at 12:00 with a 0-vicryl suture.  I obtained a deeper margin at the previous site of the raised 1:00 lesion by grasping it with an Allis clamp and using Bovie cautery to excise.  This was sent on oriented for permanent section.  The bovie was used to obtain hemostasis at the surgical bed. The subcutaneous tissues were irrigated and rendered hemostatic.  A 2-0 Vicryl was used to reapproximate the deeper site of the surgical bed in interrupted fashion.  A biopsy was performed of the hyperpigmented lesion at 11:00 which was slightly lateral to the main surgical bed.  Were sent for permanent section.  The cutaneous layer was closed with interrupted 3-0 vicryl stitches in a vertical mattress fashion to ensure a tension free and hemostatic closure.  Again the Foley catheter was present in the urethra and cooperation of the periurethral tissue was demise during the closure.  All instrument, suture, laparotomy, Ray-Tec, and needle counts were correct x2. The patient tolerated the procedure well and was taken recovery room in stable condition.

## 2018-02-05 NOTE — Transfer of Care (Signed)
Immediate Anesthesia Transfer of Care Note  Patient: Rhonda Butler  Procedure(s) Performed: WIDE LOCAL EXCISION VULVAR (N/A Vulva)  Patient Location: PACU  Anesthesia Type:General  Level of Consciousness: sedated and patient cooperative  Airway & Oxygen Therapy: Patient Spontanous Breathing and Patient connected to nasal cannula oxygen  Post-op Assessment: Report given to RN and Post -op Vital signs reviewed and stable  Post vital signs: Reviewed and stable  Last Vitals:  Vitals Value Taken Time  BP    Temp    Pulse 84 02/05/2018 11:34 AM  Resp 13 02/05/2018 11:34 AM  SpO2 95 % 02/05/2018 11:34 AM  Vitals shown include unvalidated device data.  Last Pain:  Vitals:   02/05/18 0928  TempSrc:   PainSc: 0-No pain      Patients Stated Pain Goal: 7 (33/58/25 1898)  Complications: No apparent anesthesia complications

## 2018-02-05 NOTE — Discharge Instructions (Signed)

## 2018-02-05 NOTE — Anesthesia Procedure Notes (Signed)
Procedure Name: LMA Insertion Date/Time: 02/05/2018 10:26 AM Performed by: Wanita Chamberlain, CRNA Pre-anesthesia Checklist: Patient identified, Timeout performed, Emergency Drugs available, Suction available and Patient being monitored Patient Re-evaluated:Patient Re-evaluated prior to induction Oxygen Delivery Method: Circle system utilized Preoxygenation: Pre-oxygenation with 100% oxygen Induction Type: IV induction Ventilation: Mask ventilation without difficulty LMA: LMA inserted LMA Size: 4.0 Number of attempts: 1 Placement Confirmation: positive ETCO2 and breath sounds checked- equal and bilateral Tube secured with: Tape Dental Injury: Teeth and Oropharynx as per pre-operative assessment

## 2018-02-05 NOTE — Interval H&P Note (Signed)
History and Physical Interval Note:  02/05/2018 10:12 AM  Rhonda Butler  has presented today for surgery, with the diagnosis of vulvar cancer  The various methods of treatment have been discussed with the patient and family. After consideration of risks, benefits and other options for treatment, the patient has consented to  Procedure(s): WIDE LOCAL EXCISION VULVAR (N/A) as a surgical intervention .  The patient's history has been reviewed, patient examined, no change in status, stable for surgery.  I have reviewed the patient's chart and labs.  Questions were answered to the patient's satisfaction.   Patient seen and examined. No changes to H&P.   [name]

## 2018-02-05 NOTE — Anesthesia Postprocedure Evaluation (Signed)
Anesthesia Post Note  Patient: Rhonda Butler  Procedure(s) Performed: WIDE LOCAL EXCISION VULVAR (N/A Vulva)     Patient location during evaluation: PACU Anesthesia Type: General Level of consciousness: awake and alert Pain management: pain level controlled Vital Signs Assessment: post-procedure vital signs reviewed and stable Respiratory status: spontaneous breathing, nonlabored ventilation, respiratory function stable and patient connected to nasal cannula oxygen Cardiovascular status: blood pressure returned to baseline and stable Postop Assessment: no apparent nausea or vomiting Anesthetic complications: no    Last Vitals:  Vitals:   02/05/18 1130 02/05/18 1145  BP: (!) 111/56 (!) 106/55  Pulse: 85 80  Resp: 12 12  Temp: 36.5 C   SpO2: 94% 94%    Last Pain:  Vitals:   02/05/18 1145  TempSrc:   PainSc: 0-No pain                 Barnet Glasgow

## 2018-02-06 ENCOUNTER — Encounter (HOSPITAL_BASED_OUTPATIENT_CLINIC_OR_DEPARTMENT_OTHER): Payer: Self-pay | Admitting: Obstetrics

## 2018-02-06 ENCOUNTER — Telehealth: Payer: Self-pay

## 2018-02-06 NOTE — Telephone Encounter (Signed)
Rhonda Butler called to see when she could shower and how long would she be bleeding and need to use a pad. She had surgery yesterday WLE of Nulva with Dr. Gerarda Fraction.  Told Rhonda Butler that [name], NP said that she could shower today.  The bleeding can be for several weeks.  It should lighten up in the next several days with some continual drainage until healed.  If bleeding increases she needs to call.   Pt can leave area open to the air when the bleeding has decreased enough that she can have a towel beneath her. Pt states she is doing very well at this time.

## 2018-02-18 ENCOUNTER — Inpatient Hospital Stay: Payer: Medicare Other

## 2018-02-18 ENCOUNTER — Inpatient Hospital Stay: Payer: Medicare Other | Attending: Obstetrics | Admitting: Obstetrics

## 2018-02-18 ENCOUNTER — Encounter: Payer: Self-pay | Admitting: Obstetrics

## 2018-02-18 VITALS — BP 112/64 | HR 58 | Temp 98.4°F | Resp 18 | Wt 148.9 lb

## 2018-02-18 DIAGNOSIS — R112 Nausea with vomiting, unspecified: Secondary | ICD-10-CM

## 2018-02-18 DIAGNOSIS — R102 Pelvic and perineal pain: Secondary | ICD-10-CM

## 2018-02-18 DIAGNOSIS — C519 Malignant neoplasm of vulva, unspecified: Secondary | ICD-10-CM | POA: Diagnosis not present

## 2018-02-18 LAB — BASIC METABOLIC PANEL
Anion gap: 11 (ref 3–11)
BUN: 18 mg/dL (ref 7–26)
CHLORIDE: 104 mmol/L (ref 98–109)
CO2: 24 mmol/L (ref 22–29)
Calcium: 7.3 mg/dL — ABNORMAL LOW (ref 8.4–10.4)
Creatinine, Ser: 1.33 mg/dL — ABNORMAL HIGH (ref 0.60–1.10)
GFR calc Af Amer: 46 mL/min — ABNORMAL LOW (ref >60)
GFR calc non Af Amer: 40 mL/min — ABNORMAL LOW (ref >60)
GLUCOSE: 84 mg/dL (ref 70–140)
POTASSIUM: 4.4 mmol/L (ref 3.5–5.1)
Sodium: 139 mmol/L (ref 136–145)

## 2018-02-18 NOTE — Progress Notes (Signed)
Consult Note: Gyn-Onc  Consult was requested by Dr. Phineas Real for the evaluation of Rhonda Butler 72 y.o. female  CC:  Chief Complaint  Patient presents with  . Vulvar cancer [facility])    HPI: Ms. KIMBLEY SPRAGUE  is a very nice 72 y.o.  P2  She has a history of chronic urinary tract infections.  She was actually referred to urology for this diagnosis.  She was prescribed Premarin to try to alleviate presumably her atrophy which sounds like was believed to be contributing to her symptoms.  She was also prescribed daily antibiotics for the chronic urinary tract infection.  Given the symptoms she thought that her vulvar discomfort was related to her urinary tract infections.  She ultimately discussed the worsening discomfort of the vulva with her primary care provider who then referred the patient on to gynecology where biopsies were performed of an area on the vulva at 1:00.  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  A biopsy of the "introital opening" of the vulva showed high-grade neoplasia no invasion identified.  There was no further clarification on the microscopic comment.  A Pap smear is also in the record from 12/19/2017 which was negative for malignancy this was a reflex HPV so no HPV testing was performed.   NOTE -she is a current smoker.  She also has a history of multiple sclerosis neurology follows her she states she has had multiple procedures and has not required any type of preoperative clearance or special regimen of her medication.  Interval history Since her last visit she did undergo surgical resection on 02/05/2018 and anterior partial vulvectomy was performed.  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  The tumor size was 0.1 cm.  The depth of invasion was 0.5 mm.  The margin was 3.5 mm deep margin however I did take additional tissue from  the deep margin and so the true margin is approximately 7.5 mm.  A 6-9 o'clock region was involved with VIN 2 however this is the area approximating her anterior urethral orifice.  She is doing well after the surgical resection and tolerating the incisional care.  She has minimal pain.  She denies fevers.  Today she does note that she has had some chronic nausea and loss of appetite along with some pelvic discomfort.  Measurement of disease:  Marland Kitchen Vulvar exam focusing 12-2:00 region near urethra/clitoris ** VIN2 at 6-9:00 therefore watch tissue between clitoris and urethral orifice   Radiology: . Preop CXR - negative for malignancy     No history exists.    Current Meds:  Outpatient Encounter Medications as of 02/18/2018  Medication Sig  . amantadine (SYMMETREL) 100 MG capsule Take 100 mg by mouth daily.   Marland Kitchen amLODipine (NORVASC) 5 MG tablet Take 5 mg by mouth daily.  . calcitRIOL (ROCALTROL) 0.25 MCG capsule Take 0.25 mcg by mouth every evening. 2 tablets M,W,F 1 tablet T, TH, S, SUN  . estradiol (ESTRACE) 1 MG tablet Take 1 mg by mouth daily.  Marland Kitchen levothyroxine (SYNTHROID, LEVOTHROID) 125 MCG tablet Take 125 mcg by mouth daily before breakfast.  . methadone (DOLOPHINE) 5 MG tablet Take 5 mg by mouth 2 (two) times daily.   . metoprolol tartrate (LOPRESSOR) 100 MG tablet Take 100 mg by mouth every evening.   . natalizumab (TYSABRI) 300 MG/15ML injection Inject into the vein. Once per month IV  infusion  . nitrofurantoin, macrocrystal-monohydrate, (MACROBID) 100 MG capsule Take 100 mg by mouth daily.  Marland Kitchen tiZANidine (ZANAFLEX) 2 MG tablet Take by mouth every 6 (six) hours as needed for muscle spasms.  Marland Kitchen lidocaine (XYLOCAINE) 5 % ointment Apply 1 application topically as needed. Apply to the vulvar incision (Patient not taking: Reported on 02/18/2018)   No facility-administered encounter medications on file as of 02/18/2018.     Allergy:  Allergies  Allergen Reactions  . Morphine And Related      Severe shaking  . Prednisone     Suicidal thoughts  . Adhesive [Tape] Rash    Paper tape only    Social Hx:   Social History   Socioeconomic History  . Marital status: Married    Spouse name: Not on file  . Number of children: Not on file  . Years of education: Not on file  . Highest education level: Not on file  Occupational History  . Not on file  Social Needs  . Financial resource strain: Not on file  . Food insecurity:    Worry: Not on file    Inability: Not on file  . Transportation needs:    Medical: Not on file    Non-medical: Not on file  Tobacco Use  . Smoking status: Current Every Day Smoker    Packs/day: 1.00    Types: Cigarettes  . Smokeless tobacco: Never Used  . Tobacco comment: smoked for 50 years quit 12 years, restarted 2012  Substance and Sexual Activity  . Alcohol use: Yes    Comment: Occas.  . Drug use: Never  . Sexual activity: Not Currently    Comment: 1st intercourse 72 yo-Fewer than 5 partners  Lifestyle  . Physical activity:    Days per week: Not on file    Minutes per session: Not on file  . Stress: Not on file  Relationships  . Social connections:    Talks on phone: Not on file    Gets together: Not on file    Attends religious service: Not on file    Active member of club or organization: Not on file    Attends meetings of clubs or organizations: Not on file    Relationship status: Not on file  . Intimate partner violence:    Fear of current or ex partner: Not on file    Emotionally abused: Not on file    Physically abused: Not on file    Forced sexual activity: Not on file  Other Topics Concern  . Not on file  Social History Narrative  . Not on file    Past Surgical Hx:  Past Surgical History:  Procedure Laterality Date  . ABDOMINAL HYSTERECTOMY  1978   TAH - due to "abnormal pap" and "bleeding"  . BLADDER SUSPENSION  2005  . BREAST BIOPSY Right   . CARDIAC CATHETERIZATION  12/04/2008   LV showed good LV systolic  function.  EF of 55-60%.  Left  . CERVICAL FUSION     x2  . COLONOSCOPY  08/15/2000  . LAPAROSCOPIC CHOLECYSTECTOMY    . OOPHORECTOMY  1995   "BSO" benign mass  . Radiation ablation     Thyroid cancer  . TOE AMPUTATION     8 "foot" surgeries, one with partial amuptation due to "ulcers"  . TONSILLECTOMY AND ADENOIDECTOMY    . 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 TUBAL LIGATION    . VULVECTOMY N/A 02/05/2018  Procedure: WIDE LOCAL EXCISION VULVAR;  Surgeon: [name], MD;  Location: [facility];  Service: Gynecology;  Laterality: N/A;    Past Medical Hx:  Past Medical History:  Diagnosis Date  . Arthritis   . Chronic UTI (urinary tract infection)   . CKD (chronic kidney disease), stage III ([place])   . Complication of anesthesia    Due to MS, was unable to void for 6 weeks, had to be catheterized   . Depression    5 years ago  . History of kidney stones   . Hypertension   . Hypoglycemia   . Migraines    history of, none since 1995  . MS (multiple sclerosis) ([place])   . Peripheral neuropathy    bilateral hands and feet  . Thyroid cancer ([place])   . 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 Patient has had a hysterectomy. Menarche: 72 years old P 2 Contraceptive OCP less than 10 years HRT greater than 10 years  Last Pap 12/18/2017- note this was of the vaginal cuff.  Family Hx:  Family History  Problem Relation Age of Onset  . Breast cancer Mother 61  . Cancer Mother        Lymphoma  . Heart disease Father   . Lupus Sister     Review of Systems:  Review of Systems  Constitutional: Positive for appetite change.  Gastrointestinal: Positive for nausea.  Genitourinary: Positive for pelvic pain.   All other systems reviewed and are negative.   Vitals:  Blood pressure 112/64, pulse (!) 58,  temperature 98.4 F (36.9 C), temperature source Oral, resp. rate 18, weight 148 lb 14.4 oz (67.5 kg), SpO2 100 %. Body mass index is 28.13 kg/m.   Physical Exam: ECOG PERFORMANCE STATUS: 0 - Asymptomatic  General :  Well developed, 72 y.o., female in no apparent distress HEENT:  Normocephalic/atraumatic, symmetric, EOMI, eyelids normal Neck:   No visible masses.  Respiratory:  Respirations unlabored, no use of accessory muscles CV:   Deferred Breast:  Deferred Musculoskeletal: Normal muscle strength. Abdomen:  No visible masses or protrusion Extremities:  No visible edema or deformities Skin:   Normal inspection Neuro/Psych:  No focal motor deficit, no abnormal mental status. Normal gait. Normal affect. Alert and oriented to person, place, and time   Genito Urinary exam on 01/09/18 Vulva: External female genitalia notable for a slightly raised lesion proximal a 1 cm at 1-2 o'clock previous biopsy site notable.  There is some slight encroachment on the clitoral hood.  The urethra appears free of disease.   Bladder/urethra: Urethral meatus normal in size and location. No lesions or   masses, well supported bladder Speculum exam: Vagina: No lesion, no discharge, no bleeding. Cervix: Surgically absent  Bimanual exam:  Uterus: Surgically absent   Adnexa: No masses. Rectovaginal:  Good tone, no masses, no cul de sac nodularity, no parametrial involvement or nodularity.  Oncologic Summary: 1. 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 Final Stage IA SCCa  i. Status post partial vulvectomy (<62mm depth of invasion with tumor size 0.1cm)    Assessment/Plan: 1. Discussion microinvasive cancer o We rediscussed that the majority of the excision revealed precancer. o Invasion is <13mm. No LND indicated. o Deep margin cleared by secondary excisional specimen 2. Continued follow-up o We previously discussed her tobacco use  and if this is an HPV related process then her  risk of recurrence is high o Tobacco cessation was encouraged again today o RTC 3 months for vulvoscopy o Pap / pelvic every April 3. Activity / wound care readdressed. 4. Nausea/pelvic pain o CT AP o Followup GI highly recommended   [name], MD  02/18/2018, 10:09 AM   Cc: Donalynn Furlong, MD Jani Gravel, MD

## 2018-02-18 NOTE — Patient Instructions (Signed)
1. Return in 3 months for vulvar exam 2. No strenuous activity for another 3-4 weeks 3. OK to use sitz baths 10-15 minutes as needed, be sure to keep area dry afterwards 4. Avoid tight clothes for another month 5. We will call you about the CT results

## 2018-02-21 ENCOUNTER — Encounter (HOSPITAL_COMMUNITY): Payer: Self-pay

## 2018-02-21 ENCOUNTER — Ambulatory Visit (HOSPITAL_COMMUNITY)
Admission: RE | Admit: 2018-02-21 | Discharge: 2018-02-21 | Disposition: A | Discharge status: Home / Self Care | Payer: Medicare Other | Source: Ambulatory Visit | Attending: Obstetrics | Admitting: Obstetrics

## 2018-02-21 DIAGNOSIS — R102 Pelvic and perineal pain: Secondary | ICD-10-CM | POA: Insufficient documentation

## 2018-02-21 DIAGNOSIS — K439 Ventral hernia without obstruction or gangrene: Secondary | ICD-10-CM | POA: Diagnosis not present

## 2018-02-21 DIAGNOSIS — N200 Calculus of kidney: Secondary | ICD-10-CM | POA: Diagnosis not present

## 2018-02-21 DIAGNOSIS — R112 Nausea with vomiting, unspecified: Secondary | ICD-10-CM | POA: Insufficient documentation

## 2018-02-21 DIAGNOSIS — I700 Atherosclerosis of aorta: Secondary | ICD-10-CM | POA: Diagnosis not present

## 2018-02-21 DIAGNOSIS — I2510 Atherosclerotic heart disease of native coronary artery without angina pectoris: Secondary | ICD-10-CM | POA: Insufficient documentation

## 2018-02-21 MED ORDER — IOPAMIDOL (ISOVUE-300) INJECTION 61%
INTRAVENOUS | Status: AC
  Filled 2018-02-21: qty 100

## 2018-02-21 MED ORDER — IOPAMIDOL (ISOVUE-300) INJECTION 61%
100.0000 mL | Freq: Once | INTRAVENOUS | Status: AC | PRN
  Administered 2018-02-21: 100 mL via INTRAVENOUS

## 2018-02-25 ENCOUNTER — Encounter: Payer: Self-pay | Admitting: Obstetrics

## 2018-02-25 ENCOUNTER — Telehealth: Payer: Self-pay

## 2018-02-25 NOTE — Telephone Encounter (Signed)
Outgoing call to patient regarding recent CT abd/ pelvis results per [name] NP:  "no acute findings are noted in abd or pelvis to account for your symptoms, no definite signs of metastatic disease or cancer are noted in the abd or pelvis, small periumblical hernia, kidney stones but not blocking anything, plaque seen in arteries of heart- let pt know we are sending results to her PCP."  Pt voiced understanding.  Encouraged her to call her primary physician for follow - up especially if having any symptoms not related to gyn.  Pt voiced understanding and no other needs per pt at this time.

## 2018-05-24 ENCOUNTER — Ambulatory Visit: Payer: Medicare Other | Admitting: Obstetrics

## 2018-05-25 NOTE — Progress Notes (Deleted)
Followup Note: Gyn-Onc  Consult was requested by Dr. Phineas Real for the evaluation of Rhonda Butler 72 y.o. female  CC:  No chief complaint on file. Gyn Oncologic Summary 1.  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  P2  She has a history of chronic urinary tract infections.  She was actually referred to urology for this diagnosis.  She was prescribed Premarin to try to alleviate presumably her atrophy which sounds like was believed to be contributing to her symptoms.  She was also prescribed daily antibiotics for the chronic urinary tract infection.  Given the symptoms she thought that her vulvar discomfort was related to her urinary tract infections.  She ultimately discussed the worsening discomfort of the vulva with her primary care provider who then referred the patient on to gynecology where biopsies were performed of an area on the vulva at 1:00.  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  A biopsy of the "introital opening" of the vulva showed high-grade neoplasia no invasion identified.  There was no further clarification on the microscopic comment.  A Pap smear is also in the record from 12/19/2017 which was negative for malignancy this was a reflex HPV so no HPV testing was performed.   She did undergo surgical resection on 02/05/2018 and anterior partial vulvectomy (ie wide local excision) was performed.  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  The tumor size was 0.1 cm.  The depth of invasion was 0.5 mm.  The margin was 3.5 mm deep margin however I did take additional tissue from the deep margin and so the true margin is approximately 7.5 mm.  A 6-9 o'clock region was involved with VIN 2 however this is the  area approximating her anterior urethral orifice.   NOTE -she is a current smoker.  She also has a history of multiple sclerosis neurology follows her she states she has had multiple procedures and has not required any type of preoperative clearance or special regimen of her medication.  Interval history  She returns for her first surveillance visit today. ***  Since her last visit ***   Measurement of disease:  Marland Kitchen Vulvar exam focusing 12-2:00 region near urethra/clitoris ** VIN2 at 6-9:00 therefore watch tissue between clitoris and urethral orifice   Radiology: . Preop CXR - negative for malignancy     No history exists.    Current Meds:  Outpatient Encounter Medications as of 05/27/2018  Medication Sig  . amantadine (SYMMETREL) 100 MG capsule Take 100 mg by mouth daily.   Marland Kitchen amLODipine (NORVASC) 5 MG tablet Take 5 mg by mouth daily.  . calcitRIOL (ROCALTROL) 0.25 MCG capsule Take 0.25 mcg by mouth every evening. 2 tablets M,W,F 1 tablet T, TH, S, SUN  . estradiol (ESTRACE) 1 MG tablet Take 1 mg by mouth daily.  Marland Kitchen levothyroxine (SYNTHROID, LEVOTHROID) 125 MCG tablet Take 125 mcg by mouth daily before breakfast.  . lidocaine (XYLOCAINE) 5 % ointment Apply 1 application topically as needed. Apply to the vulvar incision (Patient not taking: Reported on 02/18/2018)  . methadone (DOLOPHINE) 5 MG tablet Take 5 mg by mouth 2 (two) times daily.   . metoprolol tartrate (LOPRESSOR) 100 MG tablet Take 100 mg  by mouth every evening.   . natalizumab (TYSABRI) 300 MG/15ML injection Inject into the vein. Once per month IV infusion  . nitrofurantoin, macrocrystal-monohydrate, (MACROBID) 100 MG capsule Take 100 mg by mouth daily.  Marland Kitchen tiZANidine (ZANAFLEX) 2 MG tablet Take by mouth every 6 (six) hours as needed for muscle spasms.   No facility-administered encounter medications on file as of 05/27/2018.     Allergy:  Allergies  Allergen Reactions  . Morphine And Related     Severe shaking  .  Prednisone     Suicidal thoughts  . Adhesive [Tape] Rash    Paper tape only    Social Hx:   Social History   Socioeconomic History  . Marital status: Married    Spouse name: Not on file  . Number of children: Not on file  . Years of education: Not on file  . Highest education level: Not on file  Occupational History  . Not on file  Social Needs  . Financial resource strain: Not on file  . Food insecurity:    Worry: Not on file    Inability: Not on file  . Transportation needs:    Medical: Not on file    Non-medical: Not on file  Tobacco Use  . Smoking status: Current Every Day Smoker    Packs/day: 1.00    Types: Cigarettes  . Smokeless tobacco: Never Used  . Tobacco comment: smoked for 50 years quit 12 years, restarted 2012  Substance and Sexual Activity  . Alcohol use: Yes    Comment: Occas.  . Drug use: Never  . Sexual activity: Not Currently    Comment: 1st intercourse 72 yo-Fewer than 5 partners  Lifestyle  . Physical activity:    Days per week: Not on file    Minutes per session: Not on file  . Stress: Not on file  Relationships  . Social connections:    Talks on phone: Not on file    Gets together: Not on file    Attends religious service: Not on file    Active member of club or organization: Not on file    Attends meetings of clubs or organizations: Not on file    Relationship status: Not on file  . Intimate partner violence:    Fear of current or ex partner: Not on file    Emotionally abused: Not on file    Physically abused: Not on file    Forced sexual activity: Not on file  Other Topics Concern  . Not on file  Social History Narrative  . Not on file    Past Surgical Hx:  Past Surgical History:  Procedure Laterality Date  . ABDOMINAL HYSTERECTOMY  1978   TAH - due to "abnormal pap" and "bleeding"  . BLADDER SUSPENSION  2005  . BREAST BIOPSY Right   . CARDIAC CATHETERIZATION  12/04/2008   LV showed good LV systolic function.  EF of 55-60%.   Left  . CERVICAL FUSION     x2  . COLONOSCOPY  08/15/2000  . LAPAROSCOPIC CHOLECYSTECTOMY    . OOPHORECTOMY  1995   "BSO" benign mass  . Radiation ablation     Thyroid cancer  . TOE AMPUTATION     8 "foot" surgeries, one with partial amuptation due to "ulcers"  . TONSILLECTOMY AND ADENOIDECTOMY    . 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 TUBAL LIGATION    . VULVECTOMY N/A 02/05/2018   Procedure: WIDE LOCAL  EXCISION VULVAR;  Surgeon: [name], MD;  Location: [facility];  Service: Gynecology;  Laterality: N/A;    Past Medical Hx:  Past Medical History:  Diagnosis Date  . Arthritis   . Chronic UTI (urinary tract infection)   . CKD (chronic kidney disease), stage III ([place])   . Complication of anesthesia    Due to MS, was unable to void for 6 weeks, had to be catheterized   . Depression    5 years ago  . History of kidney stones   . Hypertension   . Hypoglycemia   . Migraines    history of, none since 1995  . MS (multiple sclerosis) ([place])   . Peripheral neuropathy    bilateral hands and feet  . Thyroid cancer ([place])   . 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 Patient has had a hysterectomy. Menarche: 72 years old P 2 Contraceptive OCP less than 10 years HRT greater than 10 years  Last Pap 12/18/2017- note this was of the vaginal cuff.  Family Hx:  Family History  Problem Relation Age of Onset  . Breast cancer Mother 24  . Cancer Mother        Lymphoma  . Heart disease Father   . Lupus Sister     Review of Systems:  Review of Systems - Oncology  Vitals:  There were no vitals taken for this visit. There is no height or weight on file to calculate BMI.   Physical Exam: ECOG PERFORMANCE STATUS: 0 - Asymptomatic  General :  Well developed, 72 y.o., female in no apparent  distress HEENT:  Normocephalic/atraumatic, symmetric, EOMI, eyelids normal Neck:   No visible masses.  Respiratory:  Respirations unlabored, no use of accessory muscles CV:   Deferred Breast:  Deferred Musculoskeletal: Normal muscle strength. Abdomen:  No visible masses or protrusion Extremities:  No visible edema or deformities Skin:   Normal inspection Neuro/Psych:  No focal motor deficit, no abnormal mental status. Normal gait. Normal affect. Alert and oriented to person, place, and time   Genito Urinary exam on 01/09/18 Vulva: External female genitalia notable for a slightly raised lesion proximal a 1 cm at 1-2 o'clock previous biopsy site notable.  There is some slight encroachment on the clitoral hood.  The urethra appears free of disease.   Bladder/urethra: Urethral meatus normal in size and location. No lesions or   masses, well supported bladder Speculum exam: Vagina: No lesion, no discharge, no bleeding. Cervix: Surgically absent  Bimanual exam:  Uterus: Surgically absent   Adnexa: No masses. Rectovaginal:  Good tone, no masses, no cul de sac nodularity, no parametrial involvement or nodularity.  Oncologic Summary: 1. 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 Final Stage IA SCCa  i. Status post partial vulvectomy (<56mm depth of invasion with tumor size 0.1cm)    Assessment/Plan: 1. Discussion microinvasive cancer o We rediscussed that the majority of the excision revealed precancer. o Invasion is <69mm. No LND indicated. o Deep margin cleared by secondary excisional specimen 2. Continued follow-up o We previously discussed her tobacco use and if this is an HPV related process then her risk of recurrence is high o Tobacco cessation was encouraged *** o RTC 3 months for vulvoscopy o Pap / pelvic every April 3. I encouraged annual followup with Dr. Phineas Real, which  can substitute for one of our visits.  Face to face time with patient was *** minutes.  Over 50% of this time was spent on counseling and coordination of care.    [name], MD  05/25/2018, 2:19 PM   Cc: Donalynn Furlong, MD Jani Gravel, MD

## 2018-05-27 ENCOUNTER — Inpatient Hospital Stay: Payer: Medicare Other | Admitting: Obstetrics

## 2018-05-27 ENCOUNTER — Telehealth: Payer: Self-pay | Admitting: *Deleted

## 2018-05-27 NOTE — Telephone Encounter (Signed)
Patient returned call and moved her appt from today to next week.

## 2018-05-29 ENCOUNTER — Ambulatory Visit: Payer: 59 | Admitting: Obstetrics

## 2018-05-29 NOTE — Progress Notes (Signed)
Followup Note: Gyn-Onc  Consult was requested by Dr. Phineas Real for the evaluation of Rhonda Butler 72 y.o. female  CC:  No chief complaint on file. Gyn Oncologic Summary 1.  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  is a very nice 72 y.o.  P2  She has a history of chronic urinary tract infections.  She was actually referred to urology for this diagnosis.  She was prescribed Premarin to try to alleviate presumably her atrophy which sounds like was believed to be contributing to her symptoms.  She was also prescribed daily antibiotics for the chronic urinary tract infection.  Given the symptoms she thought that her vulvar discomfort was related to her urinary tract infections.  She ultimately discussed the worsening discomfort of the vulva with her primary care provider who then referred the patient on to gynecology where biopsies were performed of an area on the vulva at 1:00.  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  A biopsy of the "introital opening" of the vulva showed high-grade neoplasia no invasion identified.  There was no further clarification on the microscopic comment.  A Pap smear is also in the record from 12/19/2017 which was negative for malignancy this was a reflex HPV so no HPV testing was performed.   She did undergo surgical resection on 02/05/2018 and anterior partial vulvectomy (ie wide local excision) involving the bilateral labia minor anteriorly. 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  The tumor size was 0.1 cm.  The depth of invasion was 0.5 mm.  The margin was 3.5 mm deep margin however I did take additional tissue from the deep margin and so the true margin is approximately 7.5 mm.  A 6-9  o'clock region was involved with VIN 2 however this is the area approximating her anterior urethral orifice.   NOTE -she is a current smoker.  She also has a history of multiple sclerosis neurology follows her she states she has had multiple procedures and has not required any type of preoperative clearance or special regimen of her medication.  Interval history  She returns for her first surveillance visit today. She noticed a twinge of pain "in surgical area" 2 times over the weekend. No pruritis, no bleeding.  Measurement of disease:  Marland Kitchen Vulvar exam focusing 12-2:00 region near urethra/clitoris ** VIN2 at 6-9:00 therefore watch tissue between clitoris and urethral orifice   Radiology: . Preop CXR - negative for malignancy     No history exists.    Current Meds:  Outpatient Encounter Medications as of 06/03/2018  Medication Sig  . amantadine (SYMMETREL) 100 MG capsule Take 100 mg by mouth daily.   Marland Kitchen amLODipine (NORVASC) 5 MG tablet Take 5 mg by mouth daily.  . calcitRIOL (ROCALTROL) 0.25 MCG capsule Take 0.25 mcg by mouth every evening. 2 tablets M,W,F 1 tablet T, TH, S, SUN  . estradiol (ESTRACE) 1 MG tablet Take 1 mg by mouth daily.  Marland Kitchen levothyroxine (SYNTHROID, LEVOTHROID) 125 MCG tablet Take 125 mcg by mouth daily before breakfast.  . lidocaine (XYLOCAINE) 5 % ointment Apply 1 application topically as needed. Apply to the vulvar incision (Patient not taking: Reported on 02/18/2018)  . methadone (DOLOPHINE) 5 MG tablet Take 5 mg by mouth 2 (  two) times daily.   . metoprolol tartrate (LOPRESSOR) 100 MG tablet Take 100 mg by mouth every evening.   . natalizumab (TYSABRI) 300 MG/15ML injection Inject into the vein. Once per month IV infusion  . nitrofurantoin, macrocrystal-monohydrate, (MACROBID) 100 MG capsule Take 100 mg by mouth daily.  Marland Kitchen tiZANidine (ZANAFLEX) 2 MG tablet Take by mouth every 6 (six) hours as needed for muscle spasms.   No facility-administered encounter  medications on file as of 06/03/2018.     Allergy:  Allergies  Allergen Reactions  . Morphine And Related     Severe shaking  . Prednisone     Suicidal thoughts  . Adhesive [Tape] Rash    Paper tape only    Social Hx:   Social History   Socioeconomic History  . Marital status: Married    Spouse name: Not on file  . Number of children: Not on file  . Years of education: Not on file  . Highest education level: Not on file  Occupational History  . Not on file  Social Needs  . Financial resource strain: Not on file  . Food insecurity:    Worry: Not on file    Inability: Not on file  . Transportation needs:    Medical: Not on file    Non-medical: Not on file  Tobacco Use  . Smoking status: Current Every Day Smoker    Packs/day: 1.00    Types: Cigarettes  . Smokeless tobacco: Never Used  . Tobacco comment: smoked for 50 years quit 12 years, restarted 2012  Substance and Sexual Activity  . Alcohol use: Yes    Comment: Occas.  . Drug use: Never  . Sexual activity: Not Currently    Comment: 1st intercourse 72 yo-Fewer than 5 partners  Lifestyle  . Physical activity:    Days per week: Not on file    Minutes per session: Not on file  . Stress: Not on file  Relationships  . Social connections:    Talks on phone: Not on file    Gets together: Not on file    Attends religious service: Not on file    Active member of club or organization: Not on file    Attends meetings of clubs or organizations: Not on file    Relationship status: Not on file  . Intimate partner violence:    Fear of current or ex partner: Not on file    Emotionally abused: Not on file    Physically abused: Not on file    Forced sexual activity: Not on file  Other Topics Concern  . Not on file  Social History Narrative  . Not on file    Past Surgical Hx:  Past Surgical History:  Procedure Laterality Date  . ABDOMINAL HYSTERECTOMY  1978   TAH - due to "abnormal pap" and "bleeding"  . BLADDER  SUSPENSION  2005  . BREAST BIOPSY Right   . CARDIAC CATHETERIZATION  12/04/2008   LV showed good LV systolic function.  EF of 55-60%.  Left  . CERVICAL FUSION     x2  . COLONOSCOPY  08/15/2000  . LAPAROSCOPIC CHOLECYSTECTOMY    . OOPHORECTOMY  1995   "BSO" benign mass  . Radiation ablation     Thyroid cancer  . TOE AMPUTATION     8 "foot" surgeries, one with partial amuptation due to "ulcers"  . TONSILLECTOMY AND ADENOIDECTOMY    . 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  TUBAL LIGATION    . VULVECTOMY N/A 02/05/2018   Procedure: WIDE LOCAL EXCISION VULVAR;  Surgeon: [name], MD;  Location: [facility];  Service: Gynecology;  Laterality: N/A;    Past Medical Hx:  Past Medical History:  Diagnosis Date  . Arthritis   . Chronic UTI (urinary tract infection)   . CKD (chronic kidney disease), stage III ([place])   . Complication of anesthesia    Due to MS, was unable to void for 6 weeks, had to be catheterized   . Depression    5 years ago  . History of kidney stones   . Hypertension   . Hypoglycemia   . Migraines    history of, none since 1995  . MS (multiple sclerosis) ([place])   . Peripheral neuropathy    bilateral hands and feet  . Thyroid cancer ([place])   . 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 Vulvar cancer ([place])    Stage IA (microscopic)    Past Gynecological History:   GYNECOLOGIC HISTORY:  No LMP recorded. Patient has had a hysterectomy. Menarche: 72 years old P 2 Contraceptive OCP less than 10 years HRT greater than 10 years  Last Pap 12/18/2017- note this was of the vaginal cuff.  Family Hx:  Family History  Problem Relation Age of Onset  . Breast cancer Mother 45  . Cancer Mother        Lymphoma  . Heart disease Father   . Lupus Sister     Review of Systems:  Review of Systems  Constitutional: Positive for appetite change.  Gastrointestinal: Positive for abdominal pain and vomiting.    Vitals:   There were no vitals taken for this visit. There is no height or weight on file to calculate BMI.   Physical Exam: ECOG PERFORMANCE STATUS: 0 - Asymptomatic   General :  Well developed, 72 y.o., female in no apparent distress HEENT:  Normocephalic/atraumatic, symmetric, EOMI, eyelids normal Neck:   Supple, no masses.  Lymphatics:  No cervical/ submandibular/ supraclavicular/ infraclavicular/ inguinal adenopathy Respiratory:  Respirations unlabored, no use of accessory muscles CV:   Deferred Breast:  Deferred Musculoskeletal: No CVA tenderness, normal muscle strength. Abdomen:  Soft, non-tender and nondistended. No evidence of hernia. No masses. Extremities:  No lymphedema, no erythema, non-tender. Skin:   Normal inspection Neuro/Psych:  No focal motor deficit, no abnormal mental status. Normal gait. Normal affect. Alert and oriented to person, place, and time  Genito Urinary: Vulva: EFG notable hyperpigmentation 7:00, no areas of AW. No areas concerning for disease. Some fusion of the labial crura and atrophy.  Bladder/urethra: Urethral meatus normal in size and location. No lesions or   masses, well supported bladder Speculum exam: deferred Bimanual exam: no vaginal or cuff lesions  Cervix/Uterus/Adnexa: Surgically absent  Rectovaginal:  deferred 1st Genito Urinary exam on 01/09/18 Vulva: External female genitalia notable for a slightly raised lesion proximal a 1 cm at 1-2 o'clock previous biopsy site notable.  There is some slight encroachment on the clitoral hood.  The urethra appears free of disease.    Oncologic Summary: 1. 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 Final Stage IA SCCa  i. Status post partial vulvectomy (<37mm depth of invasion with tumor size 0.1cm)    Assessment/Plan: 1. Discussion microinvasive cancer o We previously discussed that the majority of the excision revealed precancer. o Invasion is <89mm. No LND  indicated. o Deep margin cleared by secondary excisional  specimen 2. Continued follow-up o We previously discussed her tobacco use and if this is an HPV related process then her risk of recurrence is high o Tobacco cessation was previously encouraged  o RTC 3 months for vulvoscopy o Pap / pelvic / rectal every April 3. I will encourage annual followup with Dr. Phineas Real, which can substitute for one of our visits.  Face to face time with patient was 15 minutes. Over 50% of this time was spent on counseling and coordination of care.    [name], MD  05/29/2018, 2:00 PM   Cc: Donalynn Furlong, MD (referring Ob/Gyn) Jani Gravel, MD (PCP)

## 2018-06-03 ENCOUNTER — Encounter: Payer: Self-pay | Admitting: Obstetrics

## 2018-06-03 ENCOUNTER — Inpatient Hospital Stay: Payer: Medicare Other | Attending: Obstetrics | Admitting: Obstetrics

## 2018-06-03 VITALS — BP 112/64 | HR 53 | Temp 98.3°F | Resp 18 | Ht 61.5 in | Wt 143.9 lb

## 2018-06-03 DIAGNOSIS — C519 Malignant neoplasm of vulva, unspecified: Secondary | ICD-10-CM | POA: Insufficient documentation

## 2018-06-03 DIAGNOSIS — Z90710 Acquired absence of both cervix and uterus: Secondary | ICD-10-CM | POA: Diagnosis not present

## 2018-06-03 DIAGNOSIS — F17210 Nicotine dependence, cigarettes, uncomplicated: Secondary | ICD-10-CM | POA: Diagnosis not present

## 2018-06-03 NOTE — Patient Instructions (Signed)
1. Return in 3 months for vulvar exam

## 2018-06-03 NOTE — Progress Notes (Signed)
VULVOSCOPY  Rhonda Butler  The indications for vulvoscopy were reviewed.   Acetic Acid (3-5%) was applied and the vulva examined for any lesions. See physical exam findings in the note from same day for details.   No biopsy was performed.   The patient tolerated the procedure well.   

## 2018-09-01 NOTE — Progress Notes (Addendum)
Progress Note : Established Patient FOLLOW-UP    Consult was originally requested by Dr. Phineas Real for the evaluation of Rhonda Butler 72 y.o. female  CC:  Chief Complaint  Patient presents with  . Vulvar cancer [facility])  Gyn Oncologic Summary 1.  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  P2  She has a history of chronic urinary tract infections.  She was actually referred to urology for this diagnosis.  She was prescribed Premarin to try to alleviate presumably her atrophy which sounds like was believed to be contributing to her symptoms.  She was also prescribed daily antibiotics for the chronic urinary tract infection.  Given the symptoms she thought that her vulvar discomfort was related to her urinary tract infections.  She ultimately discussed the worsening discomfort of the vulva with her primary care provider who then referred the patient on to gynecology where biopsies were performed of an area on the vulva at 1:00.  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  A biopsy of the "introital opening" of the vulva showed high-grade neoplasia no invasion identified.  There was no further clarification on the microscopic comment.  A Pap smear is also in the record from 12/19/2017 which was negative for malignancy this was a reflex HPV so no HPV testing was performed.   She did undergo surgical resection on 02/05/2018 and anterior partial vulvectomy (ie wide local excision) involving the bilateral labia minor anteriorly. 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  The tumor size was 0.1 cm.  The depth of invasion was 0.5 mm.  The margin was 3.5 mm deep margin however I did take additional tissue from  the deep margin and so the true margin is approximately 7.5 mm.  A 6-9 o'clock region was involved with VIN 2 however this is the area approximating her anterior urethral orifice.   NOTE -she is a current smoker.  She also has a history of multiple sclerosis neurology follows her she states she has had multiple procedures and has not required any type of preoperative clearance or special regimen of her medication.  Interval history  She returns for another surveillance visit today.  Has a little irritation in the labia when using restroom, but no other complaints.   Measurement of disease:  Marland Kitchen Vulvar exam focusing 12-2:00 region near urethra/clitoris ** VIN2 at 6-9:00 margin of that excision; therefore watch tissue between clitoris and urethral orifice   Radiology: . Preop CXR - negative for malignancy     No history exists.    Current Meds:  Outpatient Encounter Medications as of 09/02/2018  Medication Sig  . amantadine (SYMMETREL) 100 MG capsule Take 100 mg by mouth daily.   Marland Kitchen amLODipine (NORVASC) 5 MG tablet Take 5 mg by mouth daily.  . calcitRIOL (ROCALTROL) 0.25 MCG capsule Take 0.25 mcg by mouth every evening. 2 tablets M,W,F 1 tablet T, TH, S, SUN  . cholecalciferol (VITAMIN D3) 25 MCG (1000 UT) tablet Take 1,000 Units by mouth daily. Patient unsure of does , takes medication on Wednesday and Saturday.  . estradiol (ESTRACE) 1 MG tablet Take 1 mg by mouth daily.  Marland Kitchen levothyroxine (SYNTHROID, LEVOTHROID) 125 MCG tablet Take 125 mcg by  mouth daily before breakfast.  . methadone (DOLOPHINE) 5 MG tablet Take 5 mg by mouth 2 (two) times daily.   . metoprolol tartrate (LOPRESSOR) 100 MG tablet Take 100 mg by mouth every evening.   . natalizumab (TYSABRI) 300 MG/15ML injection Inject into the vein. Once per month IV infusion  . tiZANidine (ZANAFLEX) 2 MG tablet Take by mouth every 6 (six) hours as needed for muscle spasms.  Marland Kitchen triamcinolone (KENALOG) 0.025 % ointment Apply 1  application topically 2 (two) times a week.  . [DISCONTINUED] omeprazole (PRILOSEC) 20 MG capsule Take 20 mg by mouth daily.   No facility-administered encounter medications on file as of 09/02/2018.     Allergy:  Allergies  Allergen Reactions  . Morphine And Related     Severe shaking  . Prednisone     Suicidal thoughts  . Adhesive [Tape] Rash    Paper tape only    Social Hx:   Social History   Socioeconomic History  . Marital status: Married    Spouse name: Not on file  . Number of children: Not on file  . Years of education: Not on file  . Highest education level: Not on file  Occupational History  . Not on file  Social Needs  . Financial resource strain: Not on file  . Food insecurity:    Worry: Not on file    Inability: Not on file  . Transportation needs:    Medical: Not on file    Non-medical: Not on file  Tobacco Use  . Smoking status: Current Every Day Smoker    Packs/day: 1.00    Types: Cigarettes  . Smokeless tobacco: Never Used  . Tobacco comment: smoked for 50 years quit 12 years, restarted 2012  Substance and Sexual Activity  . Alcohol use: Yes    Comment: Occas.  . Drug use: Never  . Sexual activity: Not Currently    Comment: 1st intercourse 72 yo-Fewer than 5 partners  Lifestyle  . Physical activity:    Days per week: Not on file    Minutes per session: Not on file  . Stress: Not on file  Relationships  . Social connections:    Talks on phone: Not on file    Gets together: Not on file    Attends religious service: Not on file    Active member of club or organization: Not on file    Attends meetings of clubs or organizations: Not on file    Relationship status: Not on file  . Intimate partner violence:    Fear of current or ex partner: Not on file    Emotionally abused: Not on file    Physically abused: Not on file    Forced sexual activity: Not on file  Other Topics Concern  . Not on file  Social History Narrative  . Not on file     Past Surgical Hx:  Past Surgical History:  Procedure Laterality Date  . ABDOMINAL HYSTERECTOMY  1978   TAH - due to "abnormal pap" and "bleeding"  . BLADDER SUSPENSION  2005  . BREAST BIOPSY Right   . CARDIAC CATHETERIZATION  12/04/2008   LV showed good LV systolic function.  EF of 55-60%.  Left  . CERVICAL FUSION     x2  . COLONOSCOPY  08/15/2000  . LAPAROSCOPIC CHOLECYSTECTOMY    . OOPHORECTOMY  1995   "BSO" benign mass  . Radiation ablation     Thyroid cancer  . TOE AMPUTATION  8 "foot" surgeries, one with partial amuptation due to "ulcers"  . TONSILLECTOMY AND ADENOIDECTOMY    . 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 TUBAL LIGATION    . VULVECTOMY N/A 02/05/2018   Procedure: WIDE LOCAL EXCISION VULVAR;  Surgeon: [name], MD;  Location: [facility];  Service: Gynecology;  Laterality: N/A;    Past Medical Hx:  Past Medical History:  Diagnosis Date  . Arthritis   . Chronic UTI (urinary tract infection)   . CKD (chronic kidney disease), stage III ([place])   . Complication of anesthesia    Due to MS, was unable to void for 6 weeks, had to be catheterized   . Depression    5 years ago  . History of kidney stones   . Hypertension   . Hypoglycemia   . Migraines    history of, none since 1995  . MS (multiple sclerosis) ([place])   . Peripheral neuropathy    bilateral hands and feet  . Thyroid cancer ([place])   . 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 Vulvar cancer ([place])    Stage IA (microscopic)    Past Gynecological History:   GYNECOLOGIC HISTORY:  No LMP recorded. Patient has had a hysterectomy. Menarche: 72 years old P 2 Contraceptive OCP less than 10 years HRT greater than 10 years  Last Pap 12/18/2017- note this was of the vaginal cuff.  Family Hx:  Family History  Problem Relation Age of Onset  . Breast cancer Mother 75  . Cancer Mother        Lymphoma  . Heart disease Father   . Lupus Sister      Review of Systems:  Review of Systems  Genitourinary: Positive for dysuria.   All other systems reviewed and are negative.   Vitals:  Blood pressure (!) 104/55, pulse (!) 54, temperature 97.8 F (36.6 C), temperature source Oral, resp. rate 20, height 5\' 2"  (1.575 m), weight 143 lb 1.6 oz (64.9 kg), SpO2 99 %. Body mass index is 26.17 kg/m.   Physical Exam:  General :  Well developed, 72 y.o., female in no apparent distress HEENT:  Normocephalic/atraumatic, symmetric, EOMI, eyelids normal Neck:   Supple, no masses.  Lymphatics:  No cervical/ submandibular/ supraclavicular/ infraclavicular/ inguinal adenopathy Respiratory:  Respirations unlabored, no use of accessory muscles CV:   Deferred Breast:  Deferred Musculoskeletal: No CVA tenderness, normal muscle strength. Abdomen:  Soft, non-tender and nondistended. No evidence of hernia. No masses. Extremities:  No lymphedema, no erythema, non-tender. Skin:   Normal inspection Neuro/Psych:  No focal motor deficit, no abnormal mental status. Normal gait. Normal affect. Alert and oriented to person, place, and time  Genito Urinary: Vulvoscopy : EFG notable stable hyperpigmentation 7:00, no areas of AW. No areas concerning for disease. Some fusion of the labial crura and atrophy versus lichenoid changes. Bladder/urethra: Urethral meatus normal in size and location. No lesions or   masses, well supported bladder Speculum /Bimanual exam: deferred  Cervix/Uterus/Adnexa: Surgically absent  Rectovaginal:  deferred 1st Genito Urinary exam on 01/09/18 Vulva: External female genitalia notable for a slightly raised lesion proximal a 1 cm at 1-2 o'clock previous biopsy site notable.  There is some slight encroachment on the clitoral hood.  The urethra appears free of disease.    ASSESSMENT 1. 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 Final Stage IA SCCa  i. Status post partial vulvectomy (<46mm depth  of  invasion with tumor size 0.1cm)  PLAN: 1. Microinvasive cancer o We previously discussed that the majority of the excision revealed precancer. o Invasion is <62mm. No LND indicated. o Deep margin cleared by secondary excisional specimen 2. Continued follow-up o We previously discussed her tobacco use and if this is an HPV related process then her risk of recurrence is high i. Tobacco cessation was previously encouraged  o Question lichenoid changes. We'll give her a low-dose steroid cream to start. We discussed her allergy to prednisone. I think this should be OK and she agrees. She will stop if she notices any behavioral changes o RTC 3 months for vulvoscopy (at 1 year we'll change to Q6 months and have her see Dr. Phineas Real annually in between our visits. o Pap / pelvic / rectal every April  Face to face time with patient was 15 minutes. Over 50% of this time was spent on counseling and coordination of care.    [name], MD  09/02/2018, 10:54 AM   Cc: Donalynn Furlong, MD (referring Ob/Gyn) Jani Gravel, MD (PCP)

## 2018-09-02 ENCOUNTER — Inpatient Hospital Stay: Payer: Medicare Other | Attending: Obstetrics | Admitting: Obstetrics

## 2018-09-02 ENCOUNTER — Encounter: Payer: Self-pay | Admitting: Obstetrics

## 2018-09-02 VITALS — BP 104/55 | HR 54 | Temp 97.8°F | Resp 20 | Ht 62.0 in | Wt 143.1 lb

## 2018-09-02 DIAGNOSIS — C519 Malignant neoplasm of vulva, unspecified: Secondary | ICD-10-CM | POA: Insufficient documentation

## 2018-09-02 DIAGNOSIS — F17210 Nicotine dependence, cigarettes, uncomplicated: Secondary | ICD-10-CM

## 2018-09-02 DIAGNOSIS — Z90710 Acquired absence of both cervix and uterus: Secondary | ICD-10-CM | POA: Insufficient documentation

## 2018-09-02 MED ORDER — TRIAMCINOLONE ACETONIDE 0.025 % EX OINT: 1.0000 "application " | TOPICAL_OINTMENT | CUTANEOUS | 12 refills | Status: DC

## 2018-09-02 NOTE — Patient Instructions (Signed)
Return in 3 months Apply steroid cream to vulva (small amount) twice weekly.

## 2018-09-02 NOTE — Progress Notes (Signed)
[place]  The indications for vulvoscopy were reviewed.   Acetic Acid (3-5%) was applied and the vulva examined for any lesions. See physical exam findings in the note from same day for details.   No biopsy was performed.   The patient tolerated the procedure well.

## 2018-10-31 ENCOUNTER — Telehealth: Payer: Self-pay | Admitting: *Deleted

## 2018-10-31 NOTE — Telephone Encounter (Signed)
Returned the patient's call, scheduled her to see Dr. Denman George next Friday. Patient stated "I took a flashlight and looked down there. I have some bumps and maybe a tear. I don't know, the skin just looks different to me." Patient will come to the appt next week

## 2018-11-01 ENCOUNTER — Other Ambulatory Visit: Payer: Self-pay | Admitting: Gynecology

## 2018-11-01 DIAGNOSIS — Z1231 Encounter for screening mammogram for malignant neoplasm of breast: Secondary | ICD-10-CM

## 2018-11-05 ENCOUNTER — Ambulatory Visit
Admission: RE | Admit: 2018-11-05 | Discharge: 2018-11-05 | Disposition: A | Discharge status: Home / Self Care | Payer: Medicare Other | Source: Ambulatory Visit | Attending: Gynecology | Admitting: Gynecology

## 2018-11-05 DIAGNOSIS — Z1231 Encounter for screening mammogram for malignant neoplasm of breast: Secondary | ICD-10-CM

## 2018-11-08 ENCOUNTER — Encounter: Payer: Self-pay | Admitting: Gynecologic Oncology

## 2018-11-08 ENCOUNTER — Inpatient Hospital Stay: Payer: Medicare Other | Attending: Obstetrics | Admitting: Gynecologic Oncology

## 2018-11-08 VITALS — BP 91/57 | HR 63 | Temp 98.5°F | Resp 20 | Ht 62.0 in | Wt 143.5 lb

## 2018-11-08 DIAGNOSIS — Z90710 Acquired absence of both cervix and uterus: Secondary | ICD-10-CM | POA: Diagnosis not present

## 2018-11-08 DIAGNOSIS — G35 Multiple sclerosis: Secondary | ICD-10-CM | POA: Insufficient documentation

## 2018-11-08 DIAGNOSIS — N183 Chronic kidney disease, stage 3 (moderate): Secondary | ICD-10-CM | POA: Insufficient documentation

## 2018-11-08 DIAGNOSIS — Z9079 Acquired absence of other genital organ(s): Secondary | ICD-10-CM | POA: Diagnosis not present

## 2018-11-08 DIAGNOSIS — I129 Hypertensive chronic kidney disease with stage 1 through stage 4 chronic kidney disease, or unspecified chronic kidney disease: Secondary | ICD-10-CM | POA: Insufficient documentation

## 2018-11-08 DIAGNOSIS — F17210 Nicotine dependence, cigarettes, uncomplicated: Secondary | ICD-10-CM | POA: Insufficient documentation

## 2018-11-08 DIAGNOSIS — L900 Lichen sclerosus et atrophicus: Secondary | ICD-10-CM | POA: Diagnosis not present

## 2018-11-08 DIAGNOSIS — E162 Hypoglycemia, unspecified: Secondary | ICD-10-CM | POA: Insufficient documentation

## 2018-11-08 DIAGNOSIS — C519 Malignant neoplasm of vulva, unspecified: Secondary | ICD-10-CM | POA: Insufficient documentation

## 2018-11-08 DIAGNOSIS — Z90722 Acquired absence of ovaries, bilateral: Secondary | ICD-10-CM | POA: Diagnosis not present

## 2018-11-08 MED ORDER — CLOBETASOL PROPIONATE 0.05 % EX OINT: 1.0000 "application " | TOPICAL_OINTMENT | Freq: Every day | CUTANEOUS | 1 refills | Status: AC

## 2018-11-08 NOTE — Progress Notes (Signed)
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 Microinvasive cancer  o No evidence of residual/recurrent disease o Invasion is <30mm. No LND indicated. o Deep margin cleared by secondary excisional specimen  2. Lichen Sclerosis o Will try clobetasol as this is standard of care (nightly x 6 weeks) and will then assess. o RTC 3 months for vulvoscopy (at 1 year we'll change to Q6 months and have her see Dr. Phineas Real annually in between our visits. o Pap / pelvic / rectal every April  Consult was originally requested by Dr. Phineas Real for the evaluation of NEAVEH BELANGER 73 y.o. female  CC:  Chief Complaint  Patient presents with  . Vulvar cancer [facility])  Gyn Oncologic Summary 1.  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 ISIS COSTANZA  is a very nice 73 y.o.  P2  She has a history of chronic urinary tract infections.  She was actually referred to urology for this diagnosis.  She was prescribed Premarin to try to alleviate presumably her atrophy which sounds like was believed to be contributing to her symptoms.  She was also prescribed daily antibiotics for the chronic urinary tract infection.  Given the symptoms she thought that her vulvar discomfort was related to her urinary tract infections.  She ultimately discussed the worsening discomfort of the vulva with her primary care provider who then referred the patient on to gynecology where biopsies were performed of an area on the vulva at 1:00.  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  A biopsy of the "introital opening" of the vulva  showed high-grade neoplasia no invasion identified.  There was no further clarification on the microscopic comment.  A Pap smear is also in the record from 12/19/2017 which was negative for malignancy this was a reflex HPV so no HPV testing was performed.   She did undergo surgical resection on 02/05/2018 and anterior partial vulvectomy (ie wide local excision) involving the bilateral labia minor anteriorly. 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  The tumor size was 0.1 cm.  The depth of invasion was 0.5 mm.  The margin was 3.5 mm deep margin however I did take additional tissue from the deep margin and so the true margin is approximately 7.5 mm.  A 6-9 o'clock region was involved with VIN 2 however this is the area approximating her anterior urethral orifice.   NOTE -she is a current smoker.  She also has a history of multiple sclerosis neurology follows her she states she has had multiple procedures and has not required any type of preoperative clearance or special regimen of her medication.  Interval history  She returns for another surveillance visit today.  Has a little irritation in the labia when using restroom, but no other complaints.   Measurement of disease:  Marland Kitchen Vulvar exam focusing 12-2:00 region near urethra/clitoris ** VIN2 at 6-9:00 margin of that excision; therefore watch tissue between clitoris and urethral orifice   Radiology: . Preop CXR - negative for malignancy     No history exists.    Current  Meds:  Outpatient Encounter Medications as of 11/08/2018  Medication Sig  . amantadine (SYMMETREL) 100 MG capsule Take 100 mg by mouth daily.   Marland Kitchen amLODipine (NORVASC) 5 MG tablet Take 5 mg by mouth daily.  . calcitRIOL (ROCALTROL) 0.25 MCG capsule Take 0.25 mcg by mouth every evening. 2 tablets M,W,F 1 tablet T, TH, S, SUN  . cholecalciferol (VITAMIN D3) 25 MCG (1000 UT) tablet Take 1,000 Units by  mouth daily. Patient unsure of does , takes medication on Wednesday and Saturday.  . estradiol (ESTRACE) 1 MG tablet Take 1 mg by mouth daily.  Marland Kitchen levothyroxine (SYNTHROID, LEVOTHROID) 125 MCG tablet Take 125 mcg by mouth daily before breakfast.  . methadone (DOLOPHINE) 5 MG tablet Take 5 mg by mouth 2 (two) times daily.   . metoprolol tartrate (LOPRESSOR) 100 MG tablet Take 100 mg by mouth every evening.   . natalizumab (TYSABRI) 300 MG/15ML injection Inject into the vein. Once per month IV infusion  . tiZANidine (ZANAFLEX) 2 MG tablet Take by mouth every 6 (six) hours as needed for muscle spasms.  . [DISCONTINUED] triamcinolone (KENALOG) 0.025 % ointment Apply 1 application topically 2 (two) times a week.  . clobetasol ointment (TEMOVATE) 9.62 % Apply 1 application topically at bedtime.   No facility-administered encounter medications on file as of 11/08/2018.     Allergy:  Allergies  Allergen Reactions  . Morphine And Related     Severe shaking  . Prednisone     Suicidal thoughts  . Adhesive [Tape] Rash    Paper tape only    Social Hx:   Social History   Socioeconomic History  . Marital status: Married    Spouse name: Not on file  . Number of children: Not on file  . Years of education: Not on file  . Highest education level: Not on file  Occupational History  . Not on file  Social Needs  . Financial resource strain: Not on file  . Food insecurity:    Worry: Not on file    Inability: Not on file  . Transportation needs:    Medical: Not on file    Non-medical: Not on file  Tobacco Use  . Smoking status: Current Every Day Smoker    Packs/day: 1.00    Types: Cigarettes  . Smokeless tobacco: Never Used  . Tobacco comment: smoked for 50 years quit 12 years, restarted 2012  Substance and Sexual Activity  . Alcohol use: Yes    Comment: Occas.  . Drug use: Never  . Sexual activity: Not Currently    Comment: 1st intercourse 73 yo-Fewer than 5 partners  Lifestyle  .  Physical activity:    Days per week: Not on file    Minutes per session: Not on file  . Stress: Not on file  Relationships  . Social connections:    Talks on phone: Not on file    Gets together: Not on file    Attends religious service: Not on file    Active member of club or organization: Not on file    Attends meetings of clubs or organizations: Not on file    Relationship status: Not on file  . Intimate partner violence:    Fear of current or ex partner: Not on file    Emotionally abused: Not on file    Physically abused: Not on file    Forced sexual activity: Not on file  Other Topics Concern  . Not on file  Social History Narrative  .  Not on file    Past Surgical Hx:  Past Surgical History:  Procedure Laterality Date  . ABDOMINAL HYSTERECTOMY  1978   TAH - due to "abnormal pap" and "bleeding"  . BLADDER SUSPENSION  2005  . BREAST BIOPSY Right   . CARDIAC CATHETERIZATION  12/04/2008   LV showed good LV systolic function.  EF of 55-60%.  Left  . CERVICAL FUSION     x2  . COLONOSCOPY  08/15/2000  . LAPAROSCOPIC CHOLECYSTECTOMY    . OOPHORECTOMY  1995   "BSO" benign mass  . Radiation ablation     Thyroid cancer  . TOE AMPUTATION     8 "foot" surgeries, one with partial amuptation due to "ulcers"  . TONSILLECTOMY AND ADENOIDECTOMY    . 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 TUBAL LIGATION    . VULVECTOMY N/A 02/05/2018   Procedure: WIDE LOCAL EXCISION VULVAR;  Surgeon: [name], MD;  Location: [facility];  Service: Gynecology;  Laterality: N/A;    Past Medical Hx:  Past Medical History:  Diagnosis Date  . Arthritis   . Chronic UTI (urinary tract infection)   . CKD (chronic kidney disease), stage III ([place])   . Complication of anesthesia    Due to MS, was unable to void for 6 weeks, had to be catheterized   . Depression    5 years ago  . History of kidney stones   . Hypertension   . Hypoglycemia   .  Migraines    history of, none since 1995  . MS (multiple sclerosis) ([place])   . Peripheral neuropathy    bilateral hands and feet  . Thyroid cancer ([place])   . 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 Vulvar cancer ([place])    Stage IA (microscopic)    Past Gynecological History:   GYNECOLOGIC HISTORY:  No LMP recorded. Patient has had a hysterectomy. Menarche: 73 years old P 2 Contraceptive OCP less than 10 years HRT greater than 10 years  Last Pap 12/18/2017- note this was of the vaginal cuff.  Family Hx:  Family History  Problem Relation Age of Onset  . Breast cancer Mother 69  . Cancer Mother        Lymphoma  . Heart disease Father   . Lupus Sister     Review of Systems:  Review of Systems  Genitourinary: Negative for dysuria.        Vulvar irritation  All other systems reviewed and are negative.   Vitals:  Blood pressure (!) 91/57, pulse 63, temperature 98.5 F (36.9 C), temperature source Oral, resp. rate 20, height 5\' 2"  (1.575 m), weight 143 lb 8 oz (65.1 kg), SpO2 99 %. Body mass index is 26.25 kg/m.   Physical Exam:  General :  Well developed, 73 y.o., female in no apparent distress HEENT:  Normocephalic/atraumatic, symmetric, EOMI, eyelids normal Neck:   Supple, no masses.  Lymphatics:  No cervical/ submandibular/ supraclavicular/ infraclavicular/ inguinal adenopathy Respiratory:  Respirations unlabored, no use of accessory muscles CV:   Deferred Breast:  Deferred Musculoskeletal: No CVA tenderness, normal muscle strength. Abdomen:  Soft, non-tender and nondistended. No evidence of hernia. No masses. Extremities:  No lymphedema, no erythema, non-tender. Skin:   Normal inspection Neuro/Psych:  No focal motor deficit, no abnormal mental status. Normal gait. Normal affect. Alert and oriented to person, place, and time  Genito Urinary: Vulvoscopy : lichen sclerosis and atrophy, no  dysplasia with acetic acid application. Bladder/urethra: Urethral meatus  normal in size and location. No lesions or   masses, well supported bladder Speculum Leta Speller exam: deferred  Cervix/Uterus/Adnexa: Surgically absent  Rectovaginal:  deferred 1st Genito Urinary exam on 01/09/18  Thereasa Solo, MD  11/08/2018, 2:00 PM   Cc: Donalynn Furlong, MD (referring Ob/Gyn) Jani Gravel, MD (PCP)

## 2018-11-08 NOTE — Patient Instructions (Signed)
Apply the 0.05% clobetasol ointment to the skin of the vulva and the inner lips of the vulva every night for 6 weeks. Dr Denman George will re-inspect the vulva at 6 weeks to determine if there has been improvement.

## 2018-12-02 ENCOUNTER — Ambulatory Visit: Payer: Medicare Other | Admitting: Obstetrics

## 2018-12-12 ENCOUNTER — Telehealth: Payer: Self-pay | Admitting: *Deleted

## 2018-12-12 NOTE — Telephone Encounter (Signed)
Called and spoke with the patient's husband regarding the appt for 4/3. Appt moved to 5/6, explained that we will call her the day before to pre-screen and no visitor policy.

## 2018-12-20 ENCOUNTER — Ambulatory Visit: Payer: Medicare Other | Admitting: Gynecologic Oncology

## 2019-01-15 ENCOUNTER — Telehealth: Payer: Self-pay | Admitting: *Deleted

## 2019-01-15 NOTE — Telephone Encounter (Signed)
Called and left the patient a message to call the office back. Need to change her appt for 5/6 to WebEx or phone visit

## 2019-01-16 NOTE — Telephone Encounter (Signed)
Called the patient and changed her appt to a virtual visit. Explained that she will receive an e-mail invite and instructions

## 2019-01-22 ENCOUNTER — Encounter: Payer: Self-pay | Admitting: Gynecologic Oncology

## 2019-01-22 ENCOUNTER — Inpatient Hospital Stay: Payer: Medicare Other | Attending: Obstetrics | Admitting: Gynecologic Oncology

## 2019-01-22 DIAGNOSIS — L900 Lichen sclerosus et atrophicus: Secondary | ICD-10-CM | POA: Insufficient documentation

## 2019-01-22 DIAGNOSIS — C519 Malignant neoplasm of vulva, unspecified: Secondary | ICD-10-CM | POA: Diagnosis not present

## 2019-01-22 NOTE — Progress Notes (Signed)
Gynecologic Oncology Telehealth Consult Note: Gyn-Onc  I connected with Caralee Ates on 01/22/19 at 12:15 PM EDT by telephone and verified that I am speaking with the correct person using two identifiers.  I discussed the limitations, risks, security and privacy concerns of performing an evaluation and management service by telemedicine and the availability of in-person appointments. I also discussed with the patient that there may be a patient responsible charge related to this service. The patient expressed understanding and agreed to proceed.  Other persons participating in the visit and their role in the encounter: none.  Patient's location: home Provider's location: [facility]  Chief Complaint: No chief complaint on file.  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 Microinvasive cancer  o No evidence of residual/recurrent disease on last exam from March, 2020. o Invasion is <94mm. No LND indicated. o Deep margin cleared by secondary excisional specimen o Will reevaluate in August, 2020   2. Lichen Sclerosis o s/p clobetasol nightly x 6 weeks, recommended to stop for now.  o RTC 3 months for vulvoscopy (at 1 year we'll change to Q6 months and have her see Dr. Phineas Real annually in between our visits. o Pap / pelvic / rectal every April   Consult was originally requested by Dr. Phineas Real for the evaluation of CHRYSTLE MURILLO 73 y.o. female  Gyn Oncologic Summary 1.  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 TASNIA SPEGAL  is a very nice 73 y.o.  P2  She has a history of chronic urinary tract infections.  She was actually referred to urology for this  diagnosis.  She was prescribed Premarin to try to alleviate presumably her atrophy which sounds like was believed to be contributing to her symptoms.  She was also prescribed daily antibiotics for the chronic urinary tract infection.  Given the symptoms she thought that her vulvar discomfort was related to her urinary tract infections.  She ultimately discussed the worsening discomfort of the vulva with her primary care provider who then referred the patient on to gynecology where biopsies were performed of an area on the vulva at 1:00.  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  A biopsy of the "introital opening" of the vulva showed high-grade neoplasia no invasion identified.  There was no further clarification on the microscopic comment.  A Pap smear is also in the record from 12/19/2017 which was negative for malignancy this was a reflex HPV so no HPV testing was performed.   She did undergo surgical resection on 02/05/2018 and anterior partial vulvectomy (ie wide local excision) involving the bilateral labia minor anteriorly. 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  The tumor size was 0.1 cm.  The depth of invasion was 0.5 mm.  The margin was 3.5 mm deep margin however I did take additional tissue from the deep margin and so the true margin is approximately 7.5 mm.  A 6-9 o'clock region was involved with VIN 2 however this is the area approximating her anterior urethral orifice.   NOTE -she is a current smoker.  She also has a  history of multiple sclerosis neurology follows her she states she has had multiple procedures and has not required any type of preoperative clearance or special regimen of her medication.  Interval history  She returns for a webex surveillance visit today.   She used clobetasol for 6 weeks. No pruritis. Notes skin is red  Measurement  of disease:  Marland Kitchen Vulvar exam focusing 12-2:00 region near urethra/clitoris ** VIN2 at 6-9:00 margin of that excision; therefore watch tissue between clitoris and urethral orifice   Radiology: . Preop CXR - negative for malignancy     No history exists.    Current Meds:  Outpatient Encounter Medications as of 01/22/2019  Medication Sig  . amantadine (SYMMETREL) 100 MG capsule Take 100 mg by mouth daily.   Marland Kitchen amLODipine (NORVASC) 5 MG tablet Take 5 mg by mouth daily.  . calcitRIOL (ROCALTROL) 0.25 MCG capsule Take 0.25 mcg by mouth every evening. 2 tablets M,W,F 1 tablet T, TH, S, SUN  . cholecalciferol (VITAMIN D3) 25 MCG (1000 UT) tablet Take 1,000 Units by mouth daily. Patient unsure of does , takes medication on Wednesday and Saturday.  . estradiol (ESTRACE) 1 MG tablet Take 1 mg by mouth daily.  Marland Kitchen levothyroxine (SYNTHROID, LEVOTHROID) 125 MCG tablet Take 125 mcg by mouth daily before breakfast.  . methadone (DOLOPHINE) 5 MG tablet Take 5 mg by mouth 2 (two) times daily.   . metoprolol tartrate (LOPRESSOR) 100 MG tablet Take 100 mg by mouth every evening.   . natalizumab (TYSABRI) 300 MG/15ML injection Inject into the vein. Once per month IV infusion  . tiZANidine (ZANAFLEX) 2 MG tablet Take by mouth every 6 (six) hours as needed for muscle spasms.   No facility-administered encounter medications on file as of 01/22/2019.     Allergy:  Allergies  Allergen Reactions  . Morphine And Related     Severe shaking  . Prednisone     Suicidal thoughts  . Adhesive [Tape] Rash    Paper tape only    Social Hx:   Social History   Socioeconomic History  . Marital status: Married    Spouse name: Not on file  . Number of children: Not on file  . Years of education: Not on file  . Highest education level: Not on file  Occupational History  . Not on file  Social Needs  . Financial resource strain: Not on file  . Food insecurity:    Worry: Not on file    Inability: Not on file  .  Transportation needs:    Medical: Not on file    Non-medical: Not on file  Tobacco Use  . Smoking status: Current Every Day Smoker    Packs/day: 1.00    Types: Cigarettes  . Smokeless tobacco: Never Used  . Tobacco comment: smoked for 50 years quit 12 years, restarted 2012  Substance and Sexual Activity  . Alcohol use: Yes    Comment: Occas.  . Drug use: Never  . Sexual activity: Not Currently    Comment: 1st intercourse 73 yo-Fewer than 5 partners  Lifestyle  . Physical activity:    Days per week: Not on file    Minutes per session: Not on file  . Stress: Not on file  Relationships  . Social connections:    Talks on phone: Not on file    Gets together: Not on file    Attends religious service: Not on file    Active member of club or organization: Not on file  Attends meetings of clubs or organizations: Not on file    Relationship status: Not on file  . Intimate partner violence:    Fear of current or ex partner: Not on file    Emotionally abused: Not on file    Physically abused: Not on file    Forced sexual activity: Not on file  Other Topics Concern  . Not on file  Social History Narrative  . Not on file    Past Surgical Hx:  Past Surgical History:  Procedure Laterality Date  . ABDOMINAL HYSTERECTOMY  1978   TAH - due to "abnormal pap" and "bleeding"  . BLADDER SUSPENSION  2005  . BREAST BIOPSY Right   . CARDIAC CATHETERIZATION  12/04/2008   LV showed good LV systolic function.  EF of 55-60%.  Left  . CERVICAL FUSION     x2  . COLONOSCOPY  08/15/2000  . LAPAROSCOPIC CHOLECYSTECTOMY    . OOPHORECTOMY  1995   "BSO" benign mass  . Radiation ablation     Thyroid cancer  . TOE AMPUTATION     8 "foot" surgeries, one with partial amuptation due to "ulcers"  . TONSILLECTOMY AND ADENOIDECTOMY    . 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 TUBAL LIGATION    . VULVECTOMY N/A 02/05/2018   Procedure: WIDE LOCAL EXCISION VULVAR;  Surgeon:  [name], MD;  Location: [facility];  Service: Gynecology;  Laterality: N/A;    Past Medical Hx:  Past Medical History:  Diagnosis Date  . Arthritis   . Chronic UTI (urinary tract infection)   . CKD (chronic kidney disease), stage III ([place])   . Complication of anesthesia    Due to MS, was unable to void for 6 weeks, had to be catheterized   . Depression    5 years ago  . History of kidney stones   . Hypertension   . Hypoglycemia   . Migraines    history of, none since 1995  . MS (multiple sclerosis) ([place])   . Peripheral neuropathy    bilateral hands and feet  . Thyroid cancer ([place])   . 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 Vulvar cancer ([place])    Stage IA (microscopic)    Past Gynecological History:   GYNECOLOGIC HISTORY:  No LMP recorded. Patient has had a hysterectomy. Menarche: 73 years old P 2 Contraceptive OCP less than 10 years HRT greater than 10 years  Last Pap 12/18/2017- note this was of the vaginal cuff.  Family Hx:  Family History  Problem Relation Age of Onset  . Breast cancer Mother 64  . Cancer Mother        Lymphoma  . Heart disease Father   . Lupus Sister     Review of Systems:  Review of Systems  Genitourinary: Negative for dysuria.        Vulvar irritation  All other systems reviewed and are negative.   Vitals:  There were no vitals taken for this visit. There is no height or weight on file to calculate BMI.   Physical Exam:  General :  Well developed, 73 y.o., female in no apparent distress HEENT:  Normocephalic/atraumatic, symmetric, EOMI, eyelids normal Neck:   Supple, no masses.  Lymphatics:  No cervical/ submandibular/ supraclavicular/ infraclavicular/ inguinal adenopathy Respiratory:  Respirations unlabored, no use of accessory muscles CV:   Deferred Breast:  Deferred Musculoskeletal: No CVA tenderness, normal muscle strength. Abdomen:  Soft, non-tender and nondistended. No evidence of hernia.  No masses. Extremities:  No lymphedema, no erythema, non-tender. Skin:   Normal inspection Neuro/Psych:  No focal motor deficit, no abnormal mental status. Normal gait. Normal affect. Alert and oriented to person, place, and time  Genito Urinary: last performed in March, 7858/ Vulvoscopy : lichen sclerosis and atrophy, no dysplasia with acetic acid application. Bladder/urethra: Urethral meatus normal in size and location. No lesions or   masses, well supported bladder Speculum Leta Speller exam: deferred due to webex visit  Cervix/Uterus/Adnexa: Surgically absent  Rectovaginal:  deferred   Thereasa Solo, MD  01/22/2019, 12:39 PM  I discussed the assessment and treatment plan with the patient. The patient was provided with an opportunity to ask questions and all were answered. The patient agreed with the plan and demonstrated an understanding of the instructions.   The patient was advised to call back or see an in-person evaluation if the symptoms worsen or if the condition fails to improve as anticipated.   I provided 10 minutes of face-to-face video visit time during this encounter, and > 50% was spent counseling as documented under my assessment & plan.   Cc: Donalynn Furlong, MD (referring Ob/Gyn) Jani Gravel, MD (PCP)

## 2019-01-22 NOTE — Patient Instructions (Signed)
Instructed to follow-up in August for exam. Instructed to stop clobetasol.

## 2019-06-13 IMAGING — MG DIGITAL SCREENING BILATERAL MAMMOGRAM WITH TOMO AND CAD
8 series · 8 of 24 positions shown · non-contrast
Comparison: Previous exam(s).

CLINICAL DATA: Screening.

EXAM:
DIGITAL SCREENING BILATERAL MAMMOGRAM WITH TOMO AND CAD

[L MLO synth-2D]
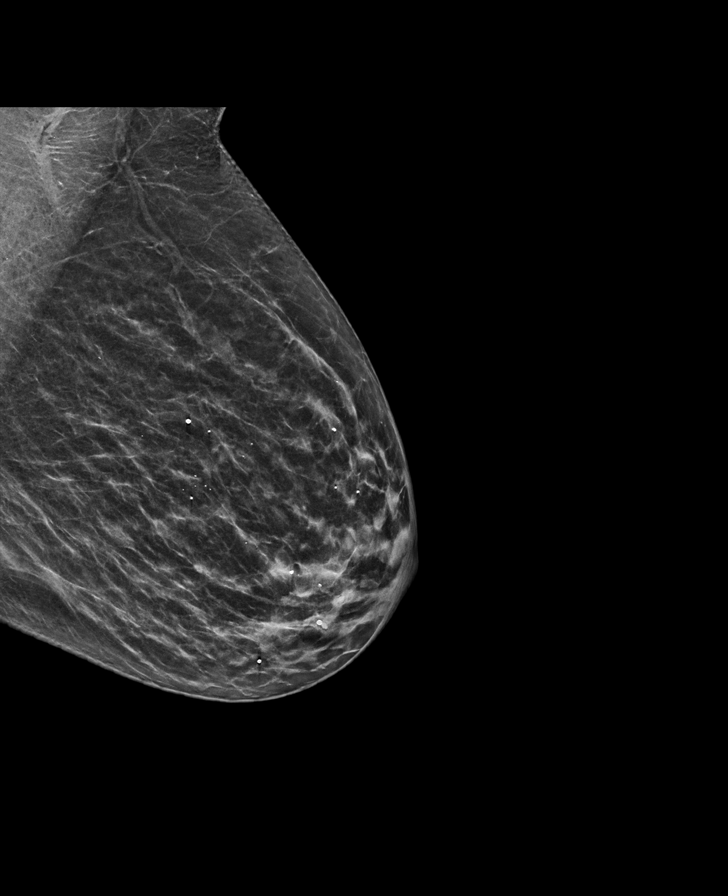

[R CC synth-2D]
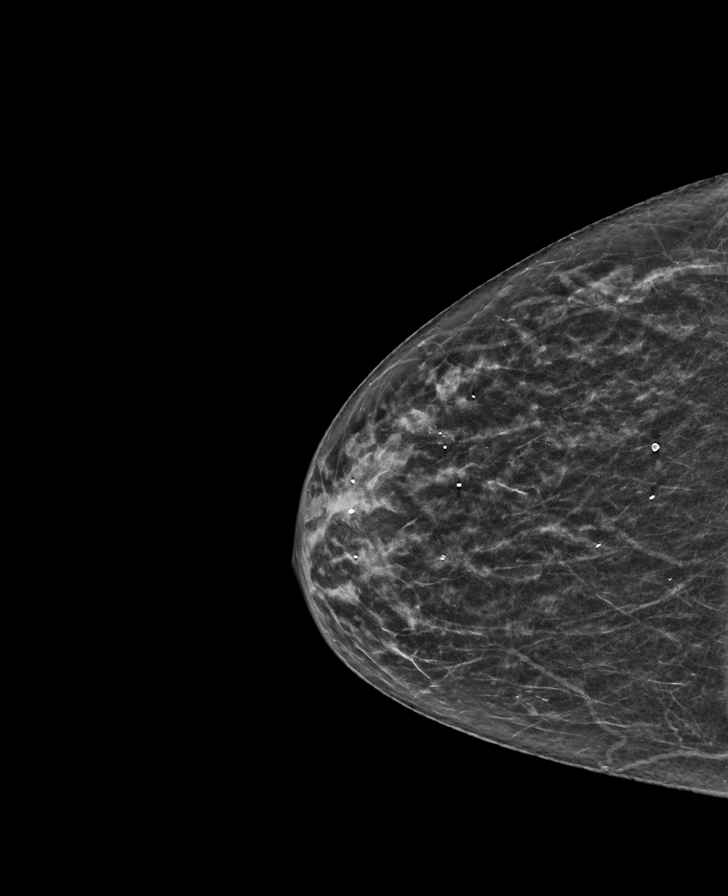

[R MLO synth-2D]
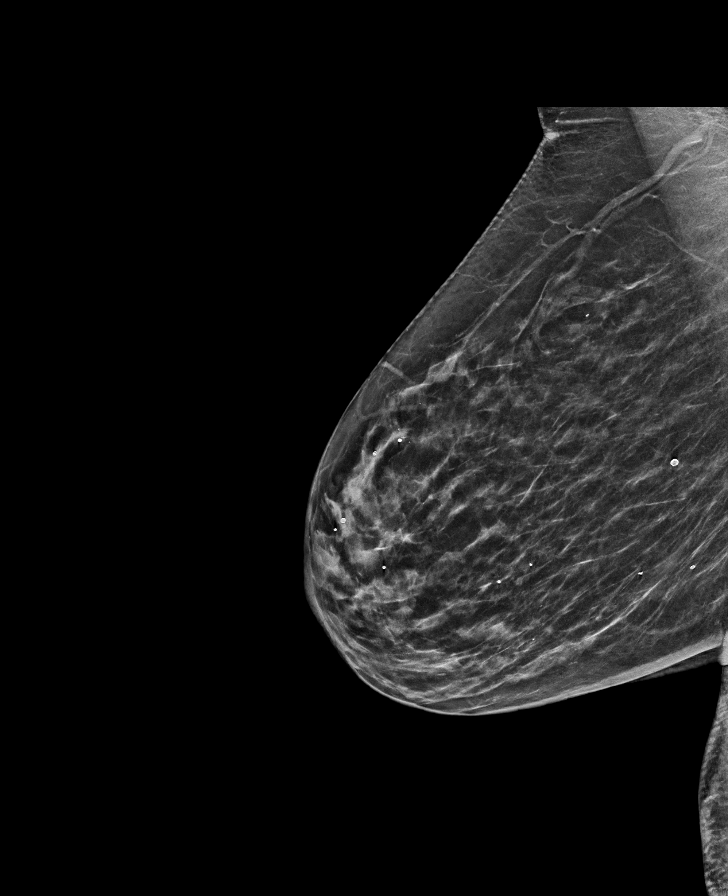

[L CC synth-2D]
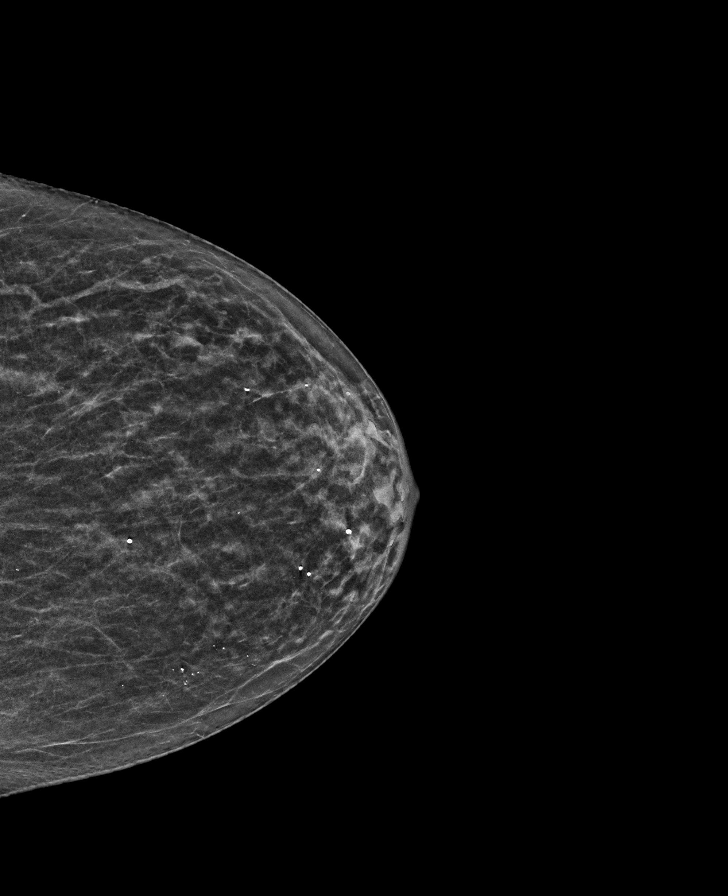

[L MLO tomo · tomo slice 29/57.0]
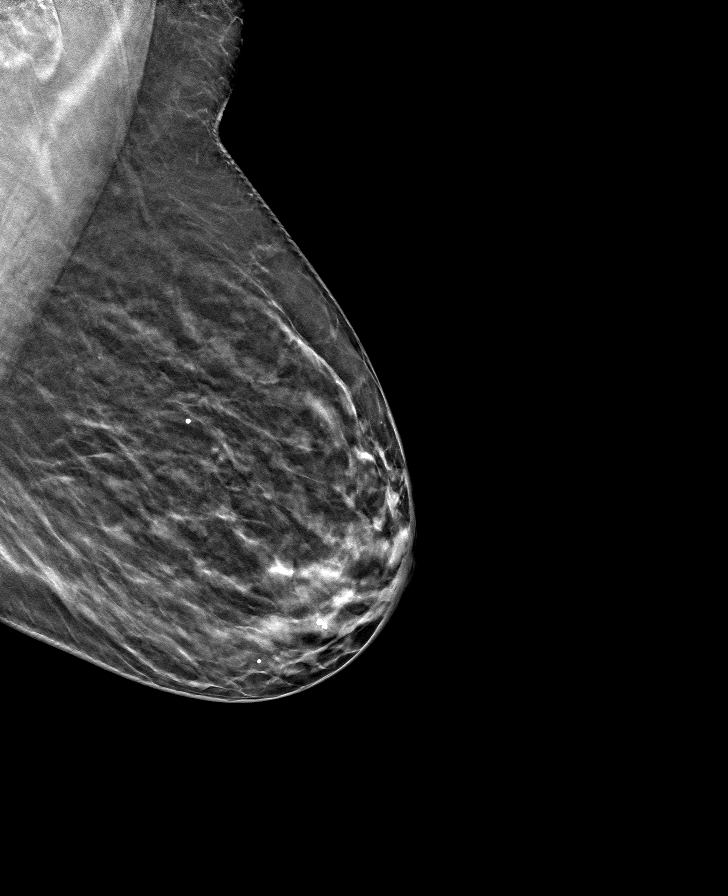

[L CC tomo · tomo slice 25/49.0]
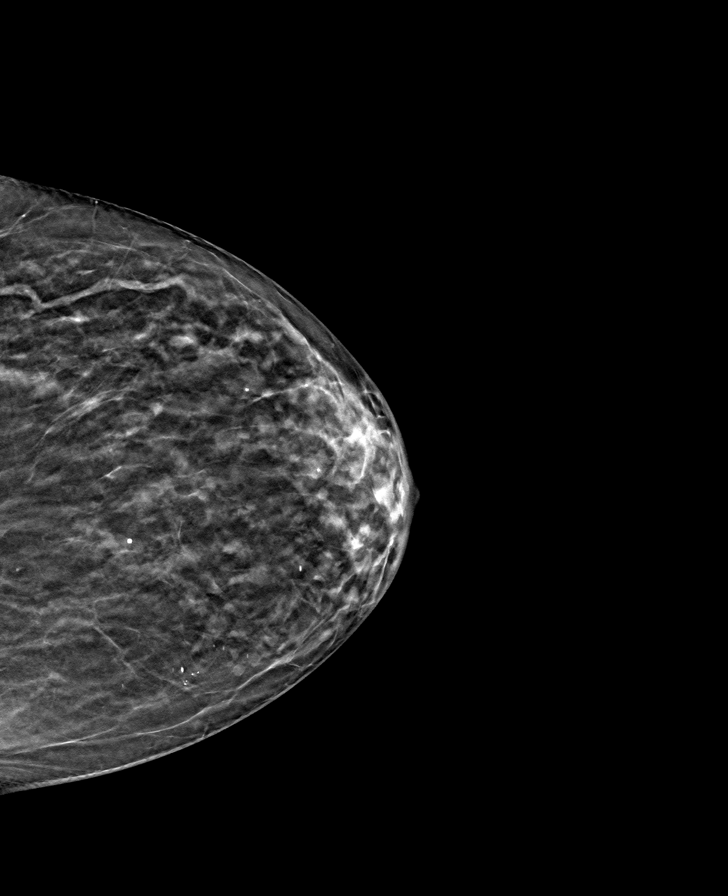

[R CC tomo · tomo slice 26/51.0]
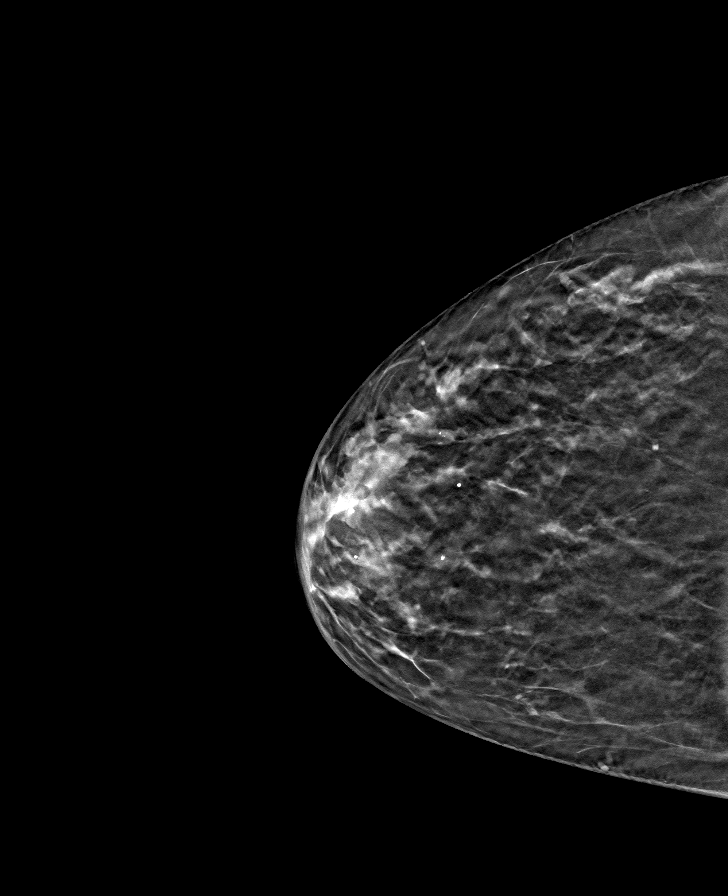

[R MLO tomo · tomo slice 29/56.0]
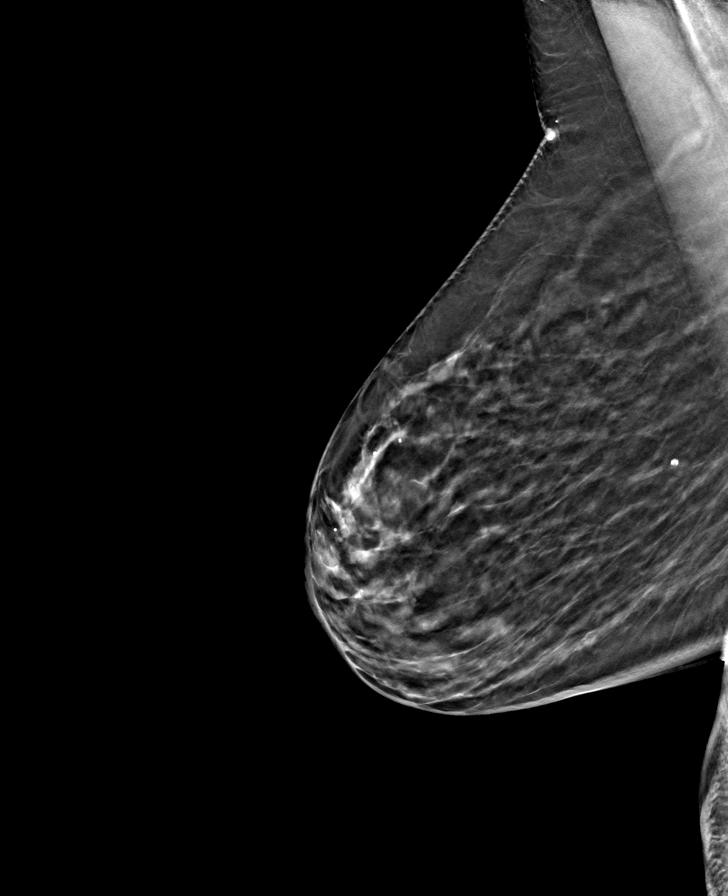

[8 of 24 positions shown; findings below may reference images not displayed]

ACR Breast Density Category c: The breast tissue is heterogeneously
dense, which may obscure small masses.
FINDINGS: There are no findings suspicious for malignancy. Images were
processed with CAD.
IMPRESSION: No mammographic evidence of malignancy. A result letter of this
screening mammogram will be mailed directly to the patient.

RECOMMENDATION:
Screening mammogram in one year. (Code:[id number]-LHB)

BI-RADS CATEGORY  1: Negative.

## 2019-06-23 ENCOUNTER — Inpatient Hospital Stay: Payer: Medicare Other | Admitting: Gynecologic Oncology

## 2019-06-25 ENCOUNTER — Encounter: Payer: Self-pay | Admitting: Gynecology

## 2019-07-07 ENCOUNTER — Other Ambulatory Visit: Payer: Self-pay

## 2019-07-07 ENCOUNTER — Encounter: Payer: Self-pay | Admitting: Gynecologic Oncology

## 2019-07-07 ENCOUNTER — Inpatient Hospital Stay: Payer: Medicare Other | Attending: Gynecologic Oncology | Admitting: Gynecologic Oncology

## 2019-07-07 VITALS — BP 112/59 | HR 59 | Temp 98.2°F | Resp 16 | Ht 62.0 in | Wt 139.5 lb

## 2019-07-07 DIAGNOSIS — C519 Malignant neoplasm of vulva, unspecified: Secondary | ICD-10-CM

## 2019-07-07 DIAGNOSIS — Z9079 Acquired absence of other genital organ(s): Secondary | ICD-10-CM | POA: Insufficient documentation

## 2019-07-07 DIAGNOSIS — L900 Lichen sclerosus et atrophicus: Secondary | ICD-10-CM

## 2019-07-07 DIAGNOSIS — N949 Unspecified condition associated with female genital organs and menstrual cycle: Secondary | ICD-10-CM | POA: Diagnosis not present

## 2019-07-07 MED ORDER — ESTROGENS, CONJUGATED 0.625 MG/GM VA CREA: 1.0000 | TOPICAL_CREAM | VAGINAL | 1 refills | Status: DC

## 2019-07-07 NOTE — Patient Instructions (Signed)
Dr Denman George recommends application of the estrogen cream to the front part of the labia 3 times a week for 3 months.  She will see you back in 3 months to inspect.   Please contact Dr Serita Grit office (at [phone]) in November, 2020 to request an appointment with her for January, 2021.

## 2019-07-07 NOTE — Progress Notes (Signed)
Gynecologic Oncology Follow-up  Chief Complaint:  Chief Complaint  Patient presents with  . 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 Microinvasive cancer  o No evidence of residual/recurrent disease on today's exam. o Invasion was <39mm. No LND indicated. o Deep margin cleared by secondary excisional specimen o Will reevaluate in January, 2021  2. Lichen Sclerosis o Recommend premarin cream to vulva 3 times per week x 3 months o RTC 3 months for vulvoscopy (at 1 year we'll change to Q6 months and have her see Dr. Phineas Real annually in between our visits. o Pap / pelvic / rectal every April   Consult was originally requested by Dr. Phineas Real for the evaluation of Rhonda Butler 73 y.o. female  Gyn Oncologic Summary 1.  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  is a very nice 73 y.o.  P2  She has a history of chronic urinary tract infections.  She was actually referred to urology for this diagnosis.  She was prescribed Premarin to try to alleviate presumably her atrophy which sounds like was believed to be contributing to her symptoms.  She was also prescribed daily antibiotics for the chronic urinary tract infection.  Given the symptoms she thought that her vulvar discomfort was related to her urinary tract infections.  She ultimately discussed the worsening discomfort of the vulva with her primary care provider who then referred the patient on to gynecology where biopsies were performed of an area on the vulva at 1:00.  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  A biopsy of the  "introital opening" of the vulva showed high-grade neoplasia no invasion identified.  There was no further clarification on the microscopic comment.  A Pap smear is also in the record from 12/19/2017 which was negative for malignancy this was a reflex HPV so no HPV testing was performed.   She did undergo surgical resection on 02/05/2018 and anterior partial vulvectomy (ie wide local excision) involving the bilateral labia minor anteriorly. This was performed by Dr Gerarda Fraction. 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  The tumor size was 0.1 cm.  The depth of invasion was 0.5 mm.  The margin was 3.5 mm deep margin however Dr Gerarda Fraction did take additional tissue from the deep margin and so the true margin is approximately 7.5 mm.  A 6-9 o'clock region was involved with VIN 2 however this is the area approximating her anterior urethral orifice.   NOTE -she is a current smoker.  She also has a history of multiple sclerosis neurology follows her she states she has had multiple procedures and has not required any type of preoperative clearance or special regimen of her medication.  Interval history  She returns for a webex surveillance visit today.   She used clobetasol for 6 weeks. No pruritis. Notes skin is red  Measurement of disease:  Marland Kitchen Vulvar exam focusing 12-2:00 region near urethra/clitoris ** VIN2 at 6-9:00 margin of that excision; therefore watch tissue between clitoris and urethral orifice   Radiology: . Preop CXR - negative for malignancy  Oncology History   No history exists.    Current Meds:  Outpatient Encounter Medications as of 07/07/2019  Medication Sig  . amantadine (SYMMETREL) 100 MG capsule Take 100 mg by mouth daily.   Marland Kitchen amLODipine (NORVASC) 5 MG tablet Take 5 mg by mouth daily.  . calcitRIOL (ROCALTROL) 0.25 MCG capsule Take 0.25 mcg by mouth every evening. 2 tablets M,W,F 1 tablet T, TH, S, SUN   . cephALEXin (KEFLEX) 500 MG capsule Take 500 mg by mouth 2 (two) times daily. X 10 days  . cholecalciferol (VITAMIN D3) 25 MCG (1000 UT) tablet Take 1,000 Units by mouth daily. Patient unsure of does , takes medication on Wednesday and Saturday.  . estradiol (ESTRACE) 1 MG tablet Take 1 mg by mouth daily.  Marland Kitchen levothyroxine (SYNTHROID, LEVOTHROID) 125 MCG tablet Take 125 mcg by mouth daily before breakfast.  . methadone (DOLOPHINE) 5 MG tablet Take 5 mg by mouth 2 (two) times daily.   . metoprolol tartrate (LOPRESSOR) 100 MG tablet Take 100 mg by mouth every evening.   . natalizumab (TYSABRI) 300 MG/15ML injection Inject into the vein. Once per month IV infusion  . tiZANidine (ZANAFLEX) 2 MG tablet Take by mouth every 6 (six) hours as needed for muscle spasms.  Marland Kitchen conjugated estrogens (PREMARIN) vaginal cream Place 1 Applicatorful vaginally 3 (three) times a week.   No facility-administered encounter medications on file as of 07/07/2019.     Allergy:  Allergies  Allergen Reactions  . Morphine And Related     Severe shaking  . Prednisone     Suicidal thoughts  . Adhesive [Tape] Rash    Paper tape only    Social Hx:   Social History   Socioeconomic History  . Marital status: Married    Spouse name: Not on file  . Number of children: Not on file  . Years of education: Not on file  . Highest education level: Not on file  Occupational History  . Not on file  Social Needs  . Financial resource strain: Not on file  . Food insecurity    Worry: Not on file    Inability: Not on file  . Transportation needs    Medical: Not on file    Non-medical: Not on file  Tobacco Use  . Smoking status: Current Every Day Smoker    Packs/day: 1.00    Types: Cigarettes  . Smokeless tobacco: Never Used  . Tobacco comment: smoked for 50 years quit 12 years, restarted 2012  Substance and Sexual Activity  . Alcohol use: Yes    Comment: Occas.  . Drug use: Never  . Sexual activity: Not Currently     Comment: 1st intercourse 73 yo-Fewer than 5 partners  Lifestyle  . Physical activity    Days per week: Not on file    Minutes per session: Not on file  . Stress: Not on file  Relationships  . Social Herbalist on phone: Not on file    Gets together: Not on file    Attends religious service: Not on file    Active member of club or organization: Not on file    Attends meetings of clubs or organizations: Not on file    Relationship status: Not on file  . Intimate partner violence    Fear of current or ex partner: Not on file    Emotionally abused: Not on file    Physically abused: Not on file    Forced sexual activity:  Not on file  Other Topics Concern  . Not on file  Social History Narrative  . Not on file    Past Surgical Hx:  Past Surgical History:  Procedure Laterality Date  . ABDOMINAL HYSTERECTOMY  1978   TAH - due to "abnormal pap" and "bleeding"  . BLADDER SUSPENSION  2005  . BREAST BIOPSY Right   . CARDIAC CATHETERIZATION  12/04/2008   LV showed good LV systolic function.  EF of 55-60%.  Left  . CERVICAL FUSION     x2  . COLONOSCOPY  08/15/2000  . LAPAROSCOPIC CHOLECYSTECTOMY    . OOPHORECTOMY  1995   "BSO" benign mass  . Radiation ablation     Thyroid cancer  . TOE AMPUTATION     8 "foot" surgeries, one with partial amuptation due to "ulcers"  . TONSILLECTOMY AND ADENOIDECTOMY    . 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 TUBAL LIGATION    . VULVECTOMY N/A 02/05/2018   Procedure: WIDE LOCAL EXCISION VULVAR;  Surgeon: [name], MD;  [facility];  Service: Gynecology;  Laterality: N/A;    Past Medical Hx:  Past Medical History:  Diagnosis Date  . Arthritis   . Chronic UTI (urinary tract infection)   . CKD (chronic kidney disease), stage III   . Complication of anesthesia    Due to MS, was unable to void for 6 weeks, had to be catheterized   . Depression    5 years ago  . History of  kidney stones   . Hypertension   . Hypoglycemia   . Migraines    history of, none since 1995  . MS (multiple sclerosis) ([place])   . Peripheral neuropathy    bilateral hands and feet  . Thyroid cancer ([place])   . 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 Vulvar cancer ([place])    Stage IA (microscopic)    Past Gynecological History:   GYNECOLOGIC HISTORY:  No LMP recorded. Patient has had a hysterectomy. Menarche: 73 years old P 2 Contraceptive OCP less than 10 years HRT greater than 10 years  Last Pap 12/18/2017- note this was of the vaginal cuff.  Family Hx:  Family History  Problem Relation Age of Onset  . Breast cancer Mother 18  . Cancer Mother        Lymphoma  . Heart disease Father   . Lupus Sister     Review of Systems:  Review of Systems  Genitourinary: Negative for dysuria.        Vulvar irritation anteriorally  All other systems reviewed and are negative.   Vitals:  Blood pressure (!) 112/59, pulse (!) 59, temperature 98.2 F (36.8 C), temperature source Temporal, resp. rate 16, height 5\' 2"  (1.575 m), weight 139 lb 8 oz (63.3 kg), SpO2 100 %. Body mass index is 25.51 kg/m.   Physical Exam:  General :  Well developed, 73 y.o., female in no apparent distress HEENT:  Normocephalic/atraumatic, symmetric, EOMI, eyelids normal Neck:   Supple, no masses.  Lymphatics:  No cervical/ submandibular/ supraclavicular/ infraclavicular/ inguinal adenopathy Respiratory:  Respirations unlabored, no use of accessory muscles CV:   Deferred Breast:  Deferred Musculoskeletal: No CVA tenderness, normal muscle strength. Abdomen:  Soft, non-tender and nondistended. No evidence of hernia. No masses. Extremities:  No lymphedema, no erythema, non-tender. Skin:   Normal inspection Neuro/Psych:  No focal motor deficit, no abnormal mental status. Normal gait. Normal  affect. Alert and oriented to person, place, and time  Genito Urinary: last performed in March, [id number] Vulvoscopy  : lichen sclerosis and atrophy, no dysplasia with acetic acid application. Agglutination of the anterior labia minora - fissures and agglutination and atrophy. No lesions or masses or acetowhite changes.  Bladder/urethra: Urethral meatus normal in size and location. No lesions or   masses, well supported bladder Speculum Leta Speller exam: deferred   Cervix/Uterus/Adnexa: Surgically absent  Rectovaginal:  deferred   Thereasa Solo, MD  07/07/2019, 3:09 PM Cc: Donalynn Furlong, MD (referring Ob/Gyn) Jani Gravel, MD (PCP)

## 2019-07-10 ENCOUNTER — Other Ambulatory Visit: Payer: Self-pay | Admitting: Gynecologic Oncology

## 2019-07-10 ENCOUNTER — Telehealth: Payer: Self-pay

## 2019-07-10 DIAGNOSIS — L900 Lichen sclerosus et atrophicus: Secondary | ICD-10-CM

## 2019-07-10 DIAGNOSIS — N949 Unspecified condition associated with female genital organs and menstrual cycle: Secondary | ICD-10-CM

## 2019-07-10 MED ORDER — ESTRADIOL 0.1 MG/GM VA CREA: 1.0000 | TOPICAL_CREAM | VAGINAL | 6 refills | Status: AC

## 2019-07-10 NOTE — Telephone Encounter (Signed)
Told Rhonda Butler that that estrace vaginal cream was sent in to [facility] in lieu of the premarin cream. The estrace will be ~ $47.00 for her and will be ready for pick up this afternoon. Pt appreciative as the premarin was ~ $340.00.

## 2019-07-10 NOTE — Progress Notes (Signed)
Sending in estrace cream to be used three times a week due to the pt's concern about the cost of premarin.

## 2019-12-03 ENCOUNTER — Inpatient Hospital Stay: Payer: Medicare Other | Attending: Obstetrics & Gynecology | Admitting: Obstetrics & Gynecology

## 2019-12-03 ENCOUNTER — Telehealth: Payer: Self-pay | Admitting: *Deleted

## 2019-12-03 ENCOUNTER — Other Ambulatory Visit: Payer: Self-pay

## 2019-12-03 ENCOUNTER — Encounter: Payer: Self-pay | Admitting: Obstetrics & Gynecology

## 2019-12-03 VITALS — BP 113/60 | HR 56 | Temp 98.2°F | Ht 62.0 in | Wt 147.1 lb

## 2019-12-03 DIAGNOSIS — C519 Malignant neoplasm of vulva, unspecified: Secondary | ICD-10-CM | POA: Diagnosis present

## 2019-12-03 DIAGNOSIS — F17210 Nicotine dependence, cigarettes, uncomplicated: Secondary | ICD-10-CM | POA: Insufficient documentation

## 2019-12-03 DIAGNOSIS — Z8249 Family history of ischemic heart disease and other diseases of the circulatory system: Secondary | ICD-10-CM | POA: Diagnosis not present

## 2019-12-03 DIAGNOSIS — N760 Acute vaginitis: Secondary | ICD-10-CM | POA: Diagnosis not present

## 2019-12-03 DIAGNOSIS — N183 Chronic kidney disease, stage 3 unspecified: Secondary | ICD-10-CM | POA: Diagnosis not present

## 2019-12-03 DIAGNOSIS — I129 Hypertensive chronic kidney disease with stage 1 through stage 4 chronic kidney disease, or unspecified chronic kidney disease: Secondary | ICD-10-CM | POA: Diagnosis not present

## 2019-12-03 DIAGNOSIS — M1990 Unspecified osteoarthritis, unspecified site: Secondary | ICD-10-CM | POA: Insufficient documentation

## 2019-12-03 DIAGNOSIS — Z79899 Other long term (current) drug therapy: Secondary | ICD-10-CM | POA: Insufficient documentation

## 2019-12-03 DIAGNOSIS — Z803 Family history of malignant neoplasm of breast: Secondary | ICD-10-CM | POA: Diagnosis not present

## 2019-12-03 MED ORDER — TERCONAZOLE 0.4 % VA CREA: 1.0000 | TOPICAL_CREAM | Freq: Every day | VAGINAL | 0 refills | Status: DC

## 2019-12-03 NOTE — Patient Instructions (Addendum)
Return in 3 months  Skin biopsy Overview A skin biopsy removes cells or skin samples from the surface of your body. The sample taken from a skin biopsy is examined to provide information about your medical condition. A doctor uses a skin biopsy to diagnose or rule out certain skin conditions and diseases.  Three main types of skin biopsies are:  Shave biopsy. A doctor uses a tool similar to a razor to remove a small section of the top layers of skin (epidermis and a portion of the dermis). Punch biopsy. A doctor uses a circular tool to remove a small section of skin including deeper layers (epidermis, dermis and superficial fat). Excisional biopsy. A doctor uses a small knife (scalpel) to remove an entire lump or an area of abnormal skin, including a portion of normal skin down to or through the fatty layer of skin. Why it's done A skin biopsy is used to diagnose or rule out skin conditions and diseases. It may also be used to remove skin lesions.  A skin biopsy may be necessary to diagnose or to help treat skin conditions and diseases, including:  Actinic keratosis Bullous pemphigoid and other blistering skin disorders Dermatitis, psoriasis and other inflammatory skin conditions Skin cancers, including basal cell carcinoma, squamous cell carcinoma and melanoma Skin infection Skin tags Suspicious moles or other growths Warts Risks A skin biopsy is a generally safe procedure, but complications can occur, including:  Bleeding Bruising Scarring Infection Allergic reaction to the topical antibiotic How you prepare Before the skin biopsy, tell your doctor if you:  Have been diagnosed with a bleeding disorder Have experienced excessive bleeding after other medical procedures Are taking blood-thinning medications, such as aspirin, aspiring-containing medications, warfarin (Coumadin) or heparin Have a history of skin infections, including impetigo Are taking medications that suppress  the immune system, such as diabetes medications or medications used after an organ transplant What you can expect Depending on the location of the skin biopsy, you may be asked to undress and change into a clean gown. A doctor or nurse then cleans the area of the skin to be biopsied. Your skin may be marked with a surgical marker or marking pen to outline the biopsy area.  You then receive a local anesthetic to numb the biopsy site. This is usually given by injection with a thin needle. The numbing medication can cause a burning sensation in the skin for a few seconds. Afterward, the biopsy site is numb and you shouldn't feel any pain or discomfort during the skin biopsy.  During the skin biopsy What you can expect during your skin biopsy depends on the type of biopsy you'll undergo.  For a shave biopsy, your doctor uses a sharp tool, double-edged razor or scalpel to cut the tissue. The depth of the incision varies depending on the type of biopsy and the part of the body being biopsied. A shave biopsy causes bleeding. Bleeding is stopped by applying pressure to the area or by a combination of pressure and a topical medication applied to the biopsy site. For a punch biopsy or an excisional biopsy, the procedure involves cutting into the top layer of fat beneath the skin, so stitches may be needed to close the wound. A dressing or adhesive bandage is then placed over the site to protect the wound and prevent bleeding. A skin biopsy typically takes about 15 minutes total, including the preparation time, dressing the wound and instructions for at-home care.  After the skin biopsy Your  doctor may instruct you to keep the bandage over the biopsy site until the next day. Occasionally, the biopsy site bleeds after you leave the doctor's office. This is more likely in people taking blood-thinning medications. If this occurs, apply direct pressure to the wound for 10 to 20 minutes. If bleeding continues, contact  your health care provider.  All biopsies cause a small scar. Some people develop a prominent, raised scar. The risk of this is increased when a biopsy is done on the neck or upper torso, such as the back or chest. Initially, the scar will be pink and then fade to white or sometimes brown. Scars fade gradually. The scar's permanent color will be evident one or two years after the biopsy.  Try not to bump the biopsy site area or do activities that might stretch the skin. Stretching the skin could cause the wound to bleed or enlarge the scar.  Healing of the wound can take several weeks, but is usually complete within two months. Wounds on legs and feet tend to heal slower than those on other areas of the body.  How to care for the biopsy site while it heals:  Wash your hands with soap and water before touching the biopsy site. Wash the biopsy site with soap and water. If the biopsy site is on your scalp, use shampoo. Rinse the site well. Pat the site dry with a clean towel. Cover the site with an adhesive bandage that allows the skin to ventilate. Continue caring for the biopsy site until the stitches are removed. For shave biopsies that don't require stitches, continue wound care until the skin is healed.  Results After the biopsy procedure, your doctor sends the sample to a laboratory for testing. Depending on the skin condition, type of biopsy and the laboratory procedures, results may take several days or a couple of weeks. Results of biopsies for metabolic or genetic testing can take several months or more.  Your doctor may schedule an office appointment to discuss the results of the test. If possible, bring along a family member or friend. It can be difficult to absorb all the information provided during an appointment. The person who accompanies you may remember something that you forgot or missed.  Write down questions that you want to ask your doctor. Don't be afraid to ask questions or  to speak up when you don't understand something. Questions you may want to ask include:  Based on the results, what are my next steps? What kind of follow-up, if any, should I expect? Are there any factors that might have affected the results of this test and, therefore, may have altered the results? Will I need to repeat the test at some point? If the skin biopsy showed skin cancer, was all of the cancer removed, or will I need additional treatment?  Gentle skin care  encouraged. Soaking in warm water, without additives, for five minutes in the morning and night hydrates the skin and relieves vulvar discomfort and pruritus.  A hand-held shower device can be used for hydration. Moisture is then sealed into the skin with the application of a nonallergenic emollient (eg, petroleum jelly) or a topical corticosteroid ointment  Vulvar/Vaginal Moisturizers  Moisturizer Options:  Vitamin E oil: pump or capsule form  Vitamin E cream (Gene's vitamin E cream)  Coconut oil: bottle or bead form  Shea butter  Blossom Organic Lubricant (organic and all natural; www.blossomorganics.com)  PE suppository(coconut oil/vitamin E/palm oil)  Desert Harvest Aloe Glide  Consider the ingredients of the product - the fewer the ingredients the better!  Directions for Use: 1. Clean and dry your hands 2. Gently dab the vulvar/vaginal area dry as needed 3. Apply a "pea-sized" amount of the moisturizer onto your fingertip 4. Using you other hand, open the labia   5. Apply the moisturizer to the vulvar/vaginal tissues 6. Wear loose fitting underwear/clothing if possible following application  Use moisturize 2-3 times daily as desired.  Healthy vulval hygiene practices Avoid Substitute  Clothing  Pantyhose Stockings with a garter belt Thigh-high or knee-high stockings   Synthetic underwear Cotton underwear or no underwear  Jeans and other tight pants Loose pants, skirts, dresses  Swimsuits,  leotards, thongs, lycra garments Loose-fitting cotton garments  Cleansing products  Scented soaps or shampoos Fragrance-free pH neutral soap  Bubble bath Tub baths in the morning and at night without additives and at a comfortable temperature  Scented detergents Unscented detergents  Baby wipes or flushable wipes Rinse with water using sports water bottle or perineal irrigation bottle  Feminine sprays, douches, powders These are not necessary products and can be omitted from personal practices  Other  Washcloths Use fingertips for washing; pat dry, do not rub dry  Panty liners Tampons or cotton pads  Dyed toilet articles Toilet articles without dyes  Hair dryers to dry vulva skin without contact Dry vulva by gentle patting

## 2019-12-03 NOTE — Telephone Encounter (Signed)
Error

## 2019-12-03 NOTE — Progress Notes (Signed)
Follow Up Note: Gyn-Onc  Rhonda Butler 74 y.o. female   CC: H/O Stage IA SCCa for interval f/u  HPI: 12/26/2017 biopsy of labia minor showed high-grade neoplasia with microinvasion not further specified.  A biopsy of the "introital opening" of the vulva showed high-grade neoplasia no invasion identified.  There was no further clarification on the microscopic comment.  A Pap smear is also in the record from 12/19/2017 which was negative for malignancy this was a reflex HPV so no HPV testing was performed.   She did undergo surgical resection on 02/05/2018 and anterior partial vulvectomy (ie wide local excision) involving the bilateral labia minor anteriorly. 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  The tumor size was 0.1 cm.  The depth of invasion was 0.5 mm.  The margin was 3.5 mm deep margin however I did take additional tissue from the deep margin and so the true margin is approximately 7.5 mm.  A 6-9 o'clock region was involved with VIN 2 however this is the area approximating her anterior urethral orifice  Interval History: Vulvoscopy at the last visit was negative.  New, tender lesion noted 1 week ago. Chronic h/o candida intertrigo.  No leg/groin pain, urinary symptoms, LE swelling, weight loss or cough.      Review of Systems Review of Systems  Constitutional: Negative for weight loss.  Respiratory: Negative for cough.   Cardiovascular: Negative for leg swelling.  Genitourinary: Negative for dysuria, frequency, hematuria and urgency.   Current Meds:  Outpatient Encounter Medications as of 12/03/2019  Medication Sig  . amantadine (SYMMETREL) 100 MG capsule Take 100 mg by mouth daily.   Marland Kitchen amLODipine (NORVASC) 5 MG tablet Take 5 mg by mouth daily.  . calcitRIOL (ROCALTROL) 0.25 MCG capsule Take 0.25 mcg by mouth every evening. 2  tablets M,W,F 1 tablet T, TH, S, SUN  . cephALEXin (KEFLEX) 500 MG capsule Take 500 mg by mouth 2 (two) times daily. X 10 days  . cholecalciferol (VITAMIN D3) 25 MCG (1000 UT) tablet Take 1,000 Units by mouth daily. Patient unsure of does , takes medication on Wednesday and Saturday.  . estradiol (ESTRACE VAGINAL) 0.1 MG/GM vaginal cream Place 1 Applicatorful vaginally 3 (three) times a week.  . estradiol (ESTRACE) 1 MG tablet Take 1 mg by mouth daily.  Marland Kitchen levothyroxine (SYNTHROID, LEVOTHROID) 125 MCG tablet Take 125 mcg by mouth daily before breakfast.  . methadone (DOLOPHINE) 5 MG tablet Take 5 mg by mouth 2 (two) times daily.   . metoprolol tartrate (LOPRESSOR) 100 MG tablet Take 100 mg by mouth every evening.   . natalizumab (TYSABRI) 300 MG/15ML injection Inject into the vein. Once per month IV infusion  . tiZANidine (ZANAFLEX) 2 MG tablet Take by mouth every 6 (six) hours as needed for muscle spasms.  Marland Kitchen terconazole (TERAZOL 7) 0.4 % vaginal cream Place 1 applicator vaginally at bedtime.   No facility-administered encounter medications on file as of 12/03/2019.    Allergy:  Allergies  Allergen Reactions  . Morphine And Related     Severe shaking  . Prednisone     Suicidal thoughts  . Adhesive [Tape] Rash    Paper tape only    Social Hx:   Social History   Socioeconomic History  . Marital status: Married    Spouse name: Not on file  . Number of children: Not on file  . Years of education: Not on file  .  Highest education level: Not on file  Occupational History  . Not on file  Tobacco Use  . Smoking status: Current Every Day Smoker    Packs/day: 1.00    Types: Cigarettes  . Smokeless tobacco: Never Used  . Tobacco comment: smoked for 50 years quit 12 years, restarted 2012  Substance and Sexual Activity  . Alcohol use: Yes    Comment: Occas.  . Drug use: Never  . Sexual activity: Not Currently    Comment: 1st intercourse 74 yo-Fewer than 5 partners  Other Topics  Concern  . Not on file  Social History Narrative  . Not on file   Social Determinants of Health   Financial Resource Strain:   . Difficulty of Paying Living Expenses:   Food Insecurity:   . Worried About Charity fundraiser in the Last Year:   . Arboriculturist in the Last Year:   Transportation Needs:   . Film/video editor (Medical):   Marland Kitchen Lack of Transportation (Non-Medical):   Physical Activity:   . Days of Exercise per Week:   . Minutes of Exercise per Session:   Stress:   . Feeling of Stress :   Social Connections:   . Frequency of Communication with Friends and Family:   . Frequency of Social Gatherings with Friends and Family:   . Attends Religious Services:   . Active Member of Clubs or Organizations:   . Attends Archivist Meetings:   Marland Kitchen Marital Status:   Intimate Partner Violence:   . Fear of Current or Ex-Partner:   . Emotionally Abused:   Marland Kitchen Physically Abused:   . Sexually Abused:     Past Surgical Hx:  Past Surgical History:  Procedure Laterality Date  . ABDOMINAL HYSTERECTOMY  1978   TAH - due to "abnormal pap" and "bleeding"  . BLADDER SUSPENSION  2005  . BREAST BIOPSY Right   . CARDIAC CATHETERIZATION  12/04/2008   LV showed good LV systolic function.  EF of 55-60%.  Left  . CERVICAL FUSION     x2  . COLONOSCOPY  08/15/2000  . LAPAROSCOPIC CHOLECYSTECTOMY    . OOPHORECTOMY  1995   "BSO" benign mass  . Radiation ablation     Thyroid cancer  . TOE AMPUTATION     8 "foot" surgeries, one with partial amuptation due to "ulcers"  . TONSILLECTOMY AND ADENOIDECTOMY    . 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 TUBAL LIGATION    . VULVECTOMY N/A 02/05/2018   Procedure: WIDE LOCAL EXCISION VULVAR;  Surgeon: [name], MD;  Location: [facility];  Service: Gynecology;  Laterality: N/A;    Past Medical Hx:  Past Medical History:  Diagnosis Date  . Arthritis   . Chronic UTI (urinary tract  infection)   . CKD (chronic kidney disease), stage III   . Complication of anesthesia    Due to MS, was unable to void for 6 weeks, had to be catheterized   . Depression    5 years ago  . History of kidney stones   . Hypertension   . Hypoglycemia   . Migraines    history of, none since 1995  . MS (multiple sclerosis) ([place])   . Peripheral neuropathy    bilateral hands and feet  . Thyroid cancer ([place])   . 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 Vulvar cancer ([place])    Stage  IA (microscopic)    Family Hx:  Family History  Problem Relation Age of Onset  . Breast cancer Mother 30  . Cancer Mother        Lymphoma  . Heart disease Father   . Lupus Sister     Vitals:  BP 113/60 (BP Location: Right Arm, Patient Position: Sitting)   Pulse (!) 56   Temp 98.2 F (36.8 C) (Temporal)   Ht 5\' 2"  (1.575 m)   Wt 147 lb 2 oz (66.7 kg)   SpO2 100%   BMI 26.91 kg/m  Physical Exam:  General: alert GI: soft, non-tender; bowel sounds normal; no masses,  no organomegaly Pelvic: no lesions/palpable abnormalities; focal tenderness left crura of clitorris, fissuring Lymphatics: no supraclavicular, inguinal lymphadenopathy    Procedure note/vulva biopsy: A time out was performed confirming the patient, procedure and allergy status. The area was prepped with betadine.  The skin was infiltrated with 1% lidocaine.  A 3 mm punch biopsy was taken.  Silver nitrate was applied.  Adequate hemostasis was noted.    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  Lahoma Crocker, MD 12/03/2019, 5:11 PM

## 2019-12-03 NOTE — Assessment & Plan Note (Addendum)
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

## 2019-12-05 ENCOUNTER — Other Ambulatory Visit: Payer: Self-pay

## 2019-12-05 LAB — SURGICAL PATHOLOGY

## 2019-12-08 ENCOUNTER — Telehealth: Payer: Self-pay

## 2019-12-08 NOTE — Telephone Encounter (Signed)
Spoke with patient to give surgical pathology results.  No pre-cancer or cancer found.  Lichen sclerosis was noted.  Patient to continue using Estrace.  Patient voiced understanding of above.  No questions or concerns at this time.

## 2019-12-09 ENCOUNTER — Telehealth: Payer: Self-pay

## 2019-12-09 NOTE — Telephone Encounter (Signed)
Told Rhonda Butler that since the pathology report from [id number]  showed the lichen sclerosis, [name], NP said that she could try using Clobetasol cream around  the clitoral area. Rhonda Schmucker stated that she would like to use the Esterase cream for now.  She did not fill the Terazol 7 cream prescribed on 12-03-19. Removed it from her medication list as she requested.

## 2020-07-05 ENCOUNTER — Ambulatory Visit
Admit: 2020-07-05 | Discharge: 2020-07-05 | Payer: MEDICARE | Attending: Internal Medicine | Primary: Diagnostic Radiology

## 2020-07-05 ENCOUNTER — Telehealth

## 2020-07-05 DIAGNOSIS — I10 Essential (primary) hypertension: Secondary | ICD-10-CM

## 2020-07-05 MED ORDER — LEVOTHYROXINE SODIUM 125 MCG PO TABS
125 MCG | ORAL_TABLET | Freq: Every day | ORAL | 0 refills | Status: DC
Start: 2020-07-05 — End: 2020-09-06

## 2020-07-05 MED ORDER — PANTOPRAZOLE SODIUM 40 MG PO TBEC
40 MG | ORAL_TABLET | Freq: Every day | ORAL | 0 refills | Status: AC
Start: 2020-07-05 — End: 2020-08-10

## 2020-07-05 MED ORDER — KETOCONAZOLE-HYDROCORTISONE 2-2.5 % EX CREA
2-2.5 | Freq: Two times a day (BID) | CUTANEOUS | 0 refills | Status: DC
Start: 2020-07-05 — End: 2020-07-05

## 2020-07-05 MED ORDER — KETOCONAZOLE 2 % EX CREA
2 | CUTANEOUS | 0 refills | Status: DC
Start: 2020-07-05 — End: 2020-08-10

## 2020-07-05 MED ORDER — HYDROCORTISONE 1 % EX CREA
1 % | CUTANEOUS | 0 refills | Status: DC
Start: 2020-07-05 — End: 2020-07-05

## 2020-07-05 NOTE — Telephone Encounter (Signed)
Will prescribe ketoconazole cream for rash.

## 2020-07-05 NOTE — Telephone Encounter (Signed)
Pharmacy called stating that they either need 2 seperate script for Ketoconazole and Hydrocortisone or an alternative cream sent in as they are unable to order the combination cream. Please advise.

## 2020-07-06 MED ORDER — CALCITRIOL 0.25 MCG PO CAPS
0.25 MCG | ORAL_CAPSULE | ORAL | 2 refills | Status: DC
Start: 2020-07-06 — End: 2020-08-10

## 2020-07-06 NOTE — Telephone Encounter (Signed)
LOV 07/05/20  NOV Visit date not found

## 2020-07-07 NOTE — Telephone Encounter (Signed)
LM letting patient know PCP filled script.

## 2020-07-22 ENCOUNTER — Ambulatory Visit
Admit: 2020-07-22 | Discharge: 2020-07-22 | Payer: MEDICARE | Attending: Internal Medicine | Primary: Diagnostic Radiology

## 2020-07-22 ENCOUNTER — Telehealth: Payer: Self-pay | Admitting: *Deleted

## 2020-07-22 DIAGNOSIS — C519 Malignant neoplasm of vulva, unspecified: Secondary | ICD-10-CM

## 2020-07-22 NOTE — Progress Notes (Signed)
[facility]  [address]  [place]  [place] [address]  Dept: [phone]  Dept Fax: [phone]    Julie Cohen is a 74 y.o. female who presents today for her medical conditions/complaints as noted below.    Chief Complaint   Patient presents with   ??? Other     Vulva irritation with history of Vulva cancer    ??? Referral - General       HPI:     74 y.o.F presented to office today as a visit as an urgent visit to discuss referral for gyn/oncology.   Patient was really concerned that she has been experiencing pain in the left vulva since few weeks.  She denied any evident mass, rash, dermatitis, vaginal bleeding or discharge or recent trauma.  She has previous history of vulvar cancer so she is really concerned and wants to be referred to gynecology/oncology for further evaluation.  Her chronic comorbidities including essential hypertension, history of thyroid cancer, neuropathy, chronic kidney disease have been stable.    She also has multiple sclerosis for which she is on infusions.  Follows up with neurology.  She is also on amantadine per patient.  No other current complaints.    Health maintenance due for DEXA scan on low-dose CT for screening of lung cancer.  Orders placed.  Requested records for colonoscopy and mammogram.  Patient mentioned that she had reaction to flu vaccine in the past so she avoids it.    Advised lifestyle changes.  Medications reviewed and refilled as appropriate, problem list updated.   All concerns discussed in details and all questions answered to patient satisfaction.            No results found for: LABA1C          ( goal A1C is < 7)   No results found for: LABMICR  No results found for: LDLCHOLESTEROL, LDLCALC    (goal LDL is <100)   No results found for: AST, ALT, BUN  BP Readings from Last 3 Encounters:   07/22/20 128/74   07/05/20 134/82          (goal 120/80)    History reviewed. No pertinent past medical history.    History reviewed. No pertinent surgical history.    History reviewed. No pertinent family history.    Social History     Tobacco Use   ??? Smoking status: Current Every Day Smoker     Packs/day: 1.00     Years: 30.00     Pack years: 30.00     Types: Cigarettes   ??? Smokeless tobacco: Never Used   Substance Use Topics   ??? Alcohol use: Not Currently      Current Outpatient Medications   Medication Sig Dispense Refill   ??? calcitRIOL (ROCALTROL) 0.25 MCG capsule Take 2 capsules Mon, Wed, Fri, Sun and take 1 capsule on the opposite days 30 capsule 2   ??? vitamin D (CHOLECALCIFEROL) 25 MCG (1000 UT) TABS tablet Take 1,000 Units by mouth      ??? estradiol (ESTRACE) 1 MG tablet 1 mg daily      ??? amLODIPine (NORVASC) 5 MG tablet Take 5 mg by mouth      ??? methadone (DOLOPHINE) 5 MG tablet TAKE 1 TABLET BY MOUTH TWICE DAILY. MUST LAST 30 DAYS     ??? metoprolol succinate (TOPROL XL) 50 MG extended release tablet Take 50 mg by mouth daily      ???  natalizumab (TYSABRI) 300 MG/15ML injection Infuse intravenously      ??? tiZANidine (ZANAFLEX) 2 MG tablet Take by mouth every 6 hours as needed      ??? pantoprazole (PROTONIX) 40 MG tablet Take 1 tablet by mouth every morning (before breakfast) for 15 days 15 tablet 0   ??? levothyroxine (SYNTHROID) 125 MCG tablet Take 1 tablet by mouth daily 30 tablet 0   ??? ketoconazole (NIZORAL) 2 % cream Apply topically daily. 1 g 0     No current facility-administered medications for this visit.     Allergies   Allergen Reactions   ??? Morphine And Related      Other reaction(s): UNSPECIFIED  Severe shaking  Severe shaking  Other reaction(s): UNSPECIFIED  Severe shaking     ??? Prednisone      Suicidal thoughts  Suicidal thoughts  Suicidal thoughts  Suicidal thoughts     ??? Propoxyphene    ??? Sulfa Antibiotics    ??? Adhesive Tape Rash     Paper tape only       Health Maintenance   Topic Date Due   ??? Potassium monitoring  Never done   ??? Creatinine monitoring  Never done   ??? Hepatitis C screen  Never done   ???  COVID-19 Vaccine (1) Never done   ??? DTaP/Tdap/Td vaccine (1 - Tdap) Never done   ??? Lipid screen  Never done   ??? Colon cancer screen colonoscopy  Never done   ??? Breast cancer screen  Never done   ??? Shingles Vaccine (1 of 2) Never done   ??? Low dose CT lung screening  Never done   ??? DEXA (modify frequency per FRAX score)  Never done   ??? Pneumococcal 65+ years Vaccine (1 of 1 - PPSV23) Never done   ??? Flu vaccine (1) Never done   ??? Annual Wellness Visit (AWV)  Never done   ??? Hepatitis A vaccine  Aged Out   ??? Hepatitis B vaccine  Aged Out   ??? Hib vaccine  Aged Out   ??? Meningococcal (ACWY) vaccine  Aged Out       Subjective:      Review of Systems   Constitutional: Negative for chills, fatigue and fever.   HENT: Negative for congestion, hearing loss and rhinorrhea.    Eyes: Negative for discharge and itching.   Respiratory: Negative for cough, shortness of breath and wheezing.    Cardiovascular: Negative for chest pain, palpitations and leg swelling.   Gastrointestinal: Negative for abdominal distention and abdominal pain.   Endocrine: Negative for polydipsia and polyphagia.   Genitourinary: Negative for difficulty urinating and dysuria.   Positive for vulvar pain on left side  Musculoskeletal: Negative for arthralgias and back pain.   Neurological: Negative for dizziness, seizures, light-headedness and headaches.   Psychiatric/Behavioral: Negative for agitation and behavioral problems.       Objective:     Physical Exam  Constitutional:       Appearance: She is well-developed.   HENT:      Head: Normocephalic and atraumatic.   Eyes:      Conjunctiva/sclera: Conjunctivae normal.      Pupils: Pupils are equal, round, and reactive to light.   Neck:      Thyroid: No thyromegaly.      Vascular: No JVD.   Cardiovascular:      Rate and Rhythm: Normal rate and regular rhythm.      Heart sounds: Normal heart sounds. No murmur  heard.     Pulmonary:      Effort: Pulmonary effort is normal.      Breath sounds: Normal breath sounds.  No wheezing.   Abdominal:      General: Bowel sounds are normal. There is no distension.      Palpations: Abdomen is soft.      Tenderness: There is no abdominal tenderness. There is no rebound.   Musculoskeletal:         General: No tenderness. Normal range of motion.      Cervical back: Normal range of motion and neck supple.     Neurological:      Mental Status: She is alert and oriented to person, place, and time.      Cranial Nerves: No cranial nerve deficit.   Psychiatric:         Behavior: Behavior normal.       BP 128/74    Pulse 55    Resp 16    Wt 139 lb 6.4 oz (63.2 kg)    SpO2 96%    BMI 27.22 kg/m??     Assessment:        Diagnoses and all orders for this visit:  Vulvar cancer ([place])  Pain in vulva  -     [place] - Bertis Ruddy, MD, Gynecologic Oncology, [place]  Annual physical exam  -     CBC Auto Differential; Future  -     Comprehensive Metabolic Panel; Future  -     TSH With Reflex Ft4; Future  -     Hemoglobin A1C; Future  -     Lipid Panel  -     Vitamin B12 & Folate; Future  Colon cancer screening  -     Cologuard (For External Results Only); Future  Vitamin D deficiency  -     Vitamin D 25 Hydroxy; Future  Primary hypertension  Gastroesophageal reflux disease without esophagitis  Neuropathy  Multiple sclerosis (HCC)  Osteoporosis, postmenopausal  Chronic kidney disease, unspecified CKD stage  H/O malignant neoplasm of thyroid  S/P hysterectomy with oophorectomy            Diagnosis Orders   1. Vulvar cancer ([place])     2. Pain in vulva  [place] - Bertis Ruddy, MD, Gynecologic Oncology, [place]   3. Annual physical exam  CBC Auto Differential    Comprehensive Metabolic Panel    TSH With Reflex Ft4    Hemoglobin A1C    Lipid Panel    Vitamin B12 & Folate   4. Colon cancer screening  Cologuard (For External Results Only)   5. Vitamin D deficiency  Vitamin D 25 Hydroxy   6. Primary hypertension     7. Gastroesophageal reflux disease without esophagitis     8. Neuropathy     9. Multiple sclerosis  ([place])     10. Osteoporosis, postmenopausal     11. Chronic kidney disease, unspecified CKD stage     12. H/O malignant neoplasm of thyroid     13. S/P hysterectomy with oophorectomy             Plan:      Return in about 6 months (around 01/19/2021) for AWV.    Orders Placed This Encounter   Procedures   ??? Cologuard (For External Results Only)     This test is performed by an external laboratory and is used for result attachment only.  It is required that this order requisition be faxed to:  [facility] @ [phone].  See [email or web address] for further information.     Standing Status:   Future     Standing Expiration Date:   07/22/2021   ??? CBC Auto Differential     Standing Status:   Future     Standing Expiration Date:   07/22/2021   ??? Comprehensive Metabolic Panel     Standing Status:   Future     Standing Expiration Date:   07/22/2021   ??? TSH With Reflex Ft4     Standing Status:   Future     Standing Expiration Date:   07/22/2021   ??? Hemoglobin A1C     Standing Status:   Future     Standing Expiration Date:   07/22/2021   ??? Lipid Panel     Order Specific Question:   Is Patient Fasting?/# of Hours     Answer:   Fast 8-10 hours   ??? Vitamin D 25 Hydroxy     Standing Status:   Future     Standing Expiration Date:   07/22/2021   ??? Vitamin B12 & Folate     Standing Status:   Future     Standing Expiration Date:   07/22/2021   ??? [place] - Bertis Ruddy, MD, Gynecologic Oncology, [place]     Referral Priority:   Routine     Referral Type:   Eval and Treat     Referral Reason:   Specialty Services Required     Requested Specialty:   Obstetrics & Gynecology     Number of Visits Requested:   1     No orders of the defined types were placed in this encounter.        Patient given educational materials - see patient instructions.  Discussed use, benefit, and side effects of prescribedmedications.  All patient questions answered. Pt voiced understanding. Reviewed health maintenance.  Instructed to continue current  medications, diet and exercise.  Patient agreed with treatment plan. Follow up as directed.    I spent a total of 20-29 minutes face to face with this patient.  Over 50% of that time was spent on counseling and care coordination.  Please see assessment and plan for details.      Electronically signed by   Evelina Dun, MD on 07/22/2020 at 2:15 PM  [facility].        Please note that this chart was generated using voice recognition [employer].  Although every effort was made to ensure the accuracy of this automatedtranscription, some errors in transcription may have occurred.Levothyroxine 125 mcg

## 2020-07-22 NOTE — Telephone Encounter (Signed)
Patient called and wants her records fax to Dr Bertis Ruddy at [facility] in [place]. Explained that the office would work on the records tomorrow to be faxed.

## 2020-07-22 NOTE — Telephone Encounter (Signed)
Pt stated that the referral for gynecologic oncology that PCP ordered for pt at appt does not take pt's medicare insurance and was advised that she would need a new referral for one that does take medicare.     Writer advised pt to get in contact with insurance company and to find out what facilities do take her insurance and to call us with what they say. Pt verbalized understanding.

## 2020-07-23 NOTE — Telephone Encounter (Signed)
Per patient request, faxed records to Dr Willodean Rosenthal at [facility] in [place]

## 2020-07-29 ENCOUNTER — Ambulatory Visit
Admit: 2020-07-29 | Discharge: 2020-07-29 | Payer: MEDICARE | Attending: Obstetrics & Gynecology | Primary: Diagnostic Radiology

## 2020-07-29 ENCOUNTER — Encounter: Payer: MEDICARE | Attending: Obstetrics & Gynecology | Primary: Diagnostic Radiology

## 2020-07-29 DIAGNOSIS — C519 Malignant neoplasm of vulva, unspecified: Secondary | ICD-10-CM

## 2020-07-29 NOTE — Progress Notes (Signed)
[facility]  [place] [address]      I am seeing Julie Cohen for New Patient at the request of Dr. Evelina Dun, her PCP.    Chief Complaint: Vulvar cancer    HPI  She is a 74 y.o. multiparous female who is being referred for a history of vulvar cancer.  Patient noted a lesion in 2019 and was referred to a local gynecologic oncologist in [facility] where she lives at that time.  A biopsy was taken on 12/26/2017 that revealed "high-grade neoplasia with microinvasion not further specified".  The biopsy was taken near the "introital opening".  A Pap smear taken on 12/19/2017 was completely negative at that time and therefore no HPV testing was performed.    On 02/05/2018 surgical resection of the high-grade neoplasia was performed.  This involved "bilateral labia minor anteriorly" including the clitoral hood.    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  The depth of invasion was 0.5 mm.  The deep margin was 3.46mm, however additional tissue from the deep margin was also taken so that the true margin was approximately 7.5 mm.  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    The patient was followed and had no evidence of recurrence.    Her last exam was on 12/03/2019.    The patient has now moved to the [place] area and asked her new PCP for a GYN oncology referral for surveillance purposes.    Patient has a history also of "lichen sclerosis".  She had been using an estrogen cream but this caused "irritation" and she therefore discontinued this 8 months ago.  For the most part she has been asymptomatic.  She occasionally notices some discomfort in the vulvar area.  She has also noticed a "pea-like area in the right groin.  This was then an area where she had a cardiac cath.    She has had no bleeding and no other symptoms.    The patient is status post TAH and BSO no.  This was performed for "preinvasive lesion" of  endometrium.    The patient continues to smoke cigarettes.  She has COPD and asthma.    Review of Systems  I have personally reviewed and agree with the review of systems done by my ancillary staff in the [facility] documentation.    OBJECTIVE:  Vitals:    07/29/20 1036   BP: 102/62   Site: Left Upper Arm   Position: Sitting   Cuff Size: Medium Adult   Pulse: 60   SpO2: 98%   Weight: 139 lb 9.6 oz (63.3 kg)   Height: 5\' 2"  (1.575 m)     She recently had a complete physical examination by her new PCP Dr. Precious Bard.    General: Alert and oriented x3, no acute distress    HEENT:  EOM intact, PERA,       Lower Extremities:  No lymphedema, no calf tenderness, no musculoskeletal deformities.    Pelvic Exam: Normal-appearing external genitalia with the exception of some disfiguration involving loss of the labia minora and clitoral hood from her previous surgeries. There are no ulcerations no rashes and no other lesions.  There is no evidence of active lichen sclerosus at atrophicus.    Careful palpation reveals no nodularity or areas of tenderness on either side of the vulva.    The introitus is small.  A small  lighted speculum was utilized to view the vagina.  There was no blood or discharge in the vault.  No lesions were seen.  Cervix is absent.  A Pap was taken of the vaginal apex because of her history of squamous cell carcinoma of the vulva (high risk factor).    Bimanual examination reveals absence of cervix and uterus.  No adnexa are palpated.    Palpation of the groin areas reveal no lymph node enlargement.  The area of question in the right groin is over the right femoral artery.  It is not a lymph node.    There was no tenderness.        No family history on file.    Assessment:  1. Vulvar cancer ([place])      2.  History of "lichen sclerosis".  Currently not being treated.  Asymptomatic.      Discussion: Patient is to continue to observe the area.  She can call if she notices any new changes.    Otherwise we will see her  again in 1 year for surveillance visit.    She was advised to stop smoking.      Plan:    1.  Return in 1 year    2.  Stop smoking            I spent 30 minutes providing care to the patient and >50% of the time was spent counseling and conoordinating care, discussing the patient's current situation, reviewing her options, counseling and education her and answering her questions.  All of the patient's pertinent images, labs and previous records and procedures were reviewed.    Electronically signed by Bertis Ruddy, MD on 07/29/20 at 11:17 AM EST

## 2020-07-29 NOTE — Patient Instructions (Signed)
Return in 1 year or sooner if you note any changes.

## 2020-07-29 NOTE — Progress Notes (Signed)
Review of Systems   Constitutional: Negative.    HENT:   Positive for lump/mass (right groin area).    Eyes: Negative.    Respiratory: Negative.    Cardiovascular: Negative.    Gastrointestinal: Negative.    Endocrine: Negative.    Genitourinary: Negative.     Musculoskeletal: Negative.    Skin: Negative.    Neurological: Negative.    Hematological: Negative.

## 2020-08-03 LAB — GYN CYTOLOGY

## 2020-08-10 ENCOUNTER — Inpatient Hospital Stay: Payer: MEDICARE | Primary: Diagnostic Radiology

## 2020-08-10 ENCOUNTER — Ambulatory Visit
Admit: 2020-08-10 | Discharge: 2020-08-10 | Payer: MEDICARE | Attending: Internal Medicine | Primary: Diagnostic Radiology

## 2020-08-10 DIAGNOSIS — R3989 Other symptoms and signs involving the genitourinary system: Secondary | ICD-10-CM

## 2020-08-10 LAB — MICROSCOPIC URINALYSIS
Casts UA: 2 /LPF (ref 0–2)
Epithelial Cells UA: 2 /HPF (ref 0–5)
RBC, UA: 5 /HPF (ref 0–2)
WBC, UA: 20 /HPF (ref 0–5)

## 2020-08-10 LAB — POCT URINALYSIS DIPSTICK
Blood, UA POC: NEGATIVE
Glucose, UA POC: 250
Ketones, UA: 15
Nitrite, UA: POSITIVE
Protein, UA POC: >=300
Spec Grav, UA: <=1.005
Urobilinogen, UA: >8
pH, UA: 5

## 2020-08-10 LAB — URINALYSIS WITH REFLEX TO CULTURE
Bilirubin Urine: NEGATIVE — AB
Glucose, Ur: NEGATIVE
Ketones, Urine: NEGATIVE
Nitrite, Urine: POSITIVE — AB
Specific Gravity, UA: 1.022 (ref 1.005–1.030)
Urobilinogen, Urine: NORMAL
pH, UA: 5.5 (ref 5.0–8.0)

## 2020-08-10 MED ORDER — CEPHALEXIN 500 MG PO CAPS
500 MG | ORAL_CAPSULE | Freq: Two times a day (BID) | ORAL | 0 refills | Status: AC
Start: 2020-08-10 — End: 2020-08-17

## 2020-08-10 MED ORDER — CALCITRIOL 0.25 MCG PO CAPS
0.25 MCG | ORAL_CAPSULE | ORAL | 2 refills | Status: AC
Start: 2020-08-10 — End: ?

## 2020-08-10 NOTE — Progress Notes (Signed)
[facility]  [address]  [place]  [place] [address]  Dept: [phone]  Dept Fax: [phone]    Julie Cohen is a 74 y.o. female who presents today for her medical conditions/complaints as noted below.    Chief Complaint   Patient presents with   ??? Urinary Tract Infection       HPI:     74 y.o.F presented to office today as an urgent visit for UTI.    She mentioned she has been experiencing dysuria, frequency and urgency along with chills since few days.  She even was passing blood clots in urine per patient.  Denied any fever, flank pain, abdominal pain, diarrhea or constipation.  She mentioned she did have a history of kidney stones in the past.  She mentioned that she also has long history of hematuria since many years.  She even had a work-up done many years ago by her last primary care provider.    All other chronic comorbidities including essential hypertension, history of thyroid cancer, neuropathy, chronic kidney disease have been stable.    She also has multiple sclerosis for which she is on infusions.  Follows up with neurology.  She is also on amantadine per patient.  No other current complaints.    Health maintenance due for DEXA scan on low-dose CT for screening of lung cancer.  Orders placed.  Requested records for colonoscopy and mammogram.  Patient mentioned that she had reaction to flu vaccine in the past so she avoids it.    Advised lifestyle changes.  Medications reviewed and refilled as appropriate, problem list updated.   All concerns discussed in details and all questions answered to patient satisfaction.            No results found for: LABA1C          ( goal A1C is < 7)   No results found for: LABMICR  No results found for: LDLCHOLESTEROL, LDLCALC    (goal LDL is <100)   No results found for: AST, ALT, BUN  BP Readings from Last 3 Encounters:   08/10/20 122/64   07/29/20 102/62   07/22/20 128/74          (goal  120/80)    History reviewed. No pertinent past medical history.   History reviewed. No pertinent surgical history.    History reviewed. No pertinent family history.    Social History     Tobacco Use   ??? Smoking status: Current Every Day Smoker     Packs/day: 1.00     Years: 30.00     Pack years: 30.00     Types: Cigarettes   ??? Smokeless tobacco: Never Used   Substance Use Topics   ??? Alcohol use: Not Currently      Current Outpatient Medications   Medication Sig Dispense Refill   ??? calcitRIOL (ROCALTROL) 0.25 MCG capsule Take 2 capsules Mon, Wed, Fri, Sun and take 1 capsule on the opposite days 40 capsule 2   ??? cephALEXin (KEFLEX) 500 MG capsule Take 1 capsule by mouth 2 times daily for 7 days 14 capsule 0   ??? vitamin D (CHOLECALCIFEROL) 25 MCG (1000 UT) TABS tablet Take 1,000 Units by mouth      ??? amLODIPine (NORVASC) 5 MG tablet Take 5 mg by mouth      ??? methadone (DOLOPHINE) 5 MG tablet TAKE 1 TABLET BY MOUTH TWICE DAILY. MUST LAST 30 DAYS     ???  metoprolol succinate (TOPROL XL) 50 MG extended release tablet Take 50 mg by mouth daily      ??? natalizumab (TYSABRI) 300 MG/15ML injection Infuse intravenously      ??? tiZANidine (ZANAFLEX) 2 MG tablet Take by mouth every 6 hours as needed      ??? pantoprazole (PROTONIX) 40 MG tablet Take 1 tablet by mouth every morning (before breakfast) for 15 days 15 tablet 0   ??? levothyroxine (SYNTHROID) 125 MCG tablet Take 1 tablet by mouth daily 30 tablet 0     No current facility-administered medications for this visit.     Allergies   Allergen Reactions   ??? Morphine And Related      Other reaction(s): UNSPECIFIED  Severe shaking  Severe shaking  Other reaction(s): UNSPECIFIED  Severe shaking     ??? Prednisone      Suicidal thoughts  Suicidal thoughts  Suicidal thoughts  Suicidal thoughts     ??? Propoxyphene    ??? Sulfa Antibiotics    ??? Adhesive Tape Rash     Paper tape only       Health Maintenance   Topic Date Due   ??? Potassium monitoring  Never done   ??? Creatinine monitoring  Never  done   ??? Hepatitis C screen  Never done   ??? COVID-19 Vaccine (1) Never done   ??? DTaP/Tdap/Td vaccine (1 - Tdap) Never done   ??? Lipid screen  Never done   ??? Colon cancer screen colonoscopy  Never done   ??? Breast cancer screen  Never done   ??? Shingles Vaccine (1 of 2) Never done   ??? Low dose CT lung screening  Never done   ??? DEXA (modify frequency per FRAX score)  Never done   ??? Annual Wellness Visit (AWV)  Never done   ??? Pneumococcal 65+ years Vaccine (1 of 1 - PPSV23) 08/10/2020 (Originally 06/14/2013)   ??? Flu vaccine (1) 08/10/2021 (Originally 05/19/2020)   ??? Hepatitis A vaccine  Aged Out   ??? Hepatitis B vaccine  Aged Out   ??? Hib vaccine  Aged Out   ??? Meningococcal (ACWY) vaccine  Aged Out       Subjective:      Review of Systems   Constitutional: Negative for chills, fatigue and fever.   HENT: Negative for congestion, hearing loss and rhinorrhea.    Eyes: Negative for discharge and itching.   Respiratory: Negative for cough, shortness of breath and wheezing.    Cardiovascular: Negative for chest pain, palpitations and leg swelling.   Gastrointestinal: Negative for abdominal distention and abdominal pain.   Endocrine: Negative for polydipsia and polyphagia.   Genitourinary: Positive for urgency, dysuria and frequency.    Musculoskeletal: Negative for arthralgias and back pain.   Neurological: Negative for dizziness, seizures, light-headedness and headaches.   Psychiatric/Behavioral: Negative for agitation and behavioral problems.       Objective:     Physical Exam  Constitutional:       Appearance: She is well-developed.   HENT:      Head: Normocephalic and atraumatic.   Eyes:      Conjunctiva/sclera: Conjunctivae normal.      Pupils: Pupils are equal, round, and reactive to light.   Neck:      Thyroid: No thyromegaly.      Vascular: No JVD.   Cardiovascular:      Rate and Rhythm: Normal rate and regular rhythm.      Heart sounds: Normal heart sounds.  No murmur heard.     Pulmonary:      Effort: Pulmonary effort is  normal.      Breath sounds: Normal breath sounds. No wheezing.   Abdominal:      General: Bowel sounds are normal. There is no distension.      Palpations: Abdomen is soft.      Tenderness: There is no abdominal tenderness. There is no rebound.   Musculoskeletal:         General: No tenderness. Normal range of motion.      Cervical back: Normal range of motion and neck supple.     Neurological:      Mental Status: She is alert and oriented to person, place, and time.      Cranial Nerves: No cranial nerve deficit.   Psychiatric:         Behavior: Behavior normal.       BP 122/64    Pulse 68    Resp 16    Wt 139 lb 3.2 oz (63.1 kg)    SpO2 98%    BMI 25.46 kg/m??     Assessment:        Diagnoses and all orders for this visit:  Suspected urinary tract infection  -     POCT Urinalysis no Micro  -     Urinalysis Reflex to Culture; Future  -     cephALEXin (KEFLEX) 500 MG capsule; Take 1 capsule by mouth 2 times daily for 7 days  Chronic kidney disease, unspecified CKD stage  -     calcitRIOL (ROCALTROL) 0.25 MCG capsule; Take 2 capsules Mon, Wed, Fri, Sun and take 1 capsule on the opposite days  -     AFL([facility]) - Dorena Cookey, MD, Nephrology, [place]  Hematuria due to chronic cystitis  -     AFL - Jean Rosenthal, MD, Urology, [place]  Primary hypertension  Multiple sclerosis [facility])            Diagnosis Orders   1. Suspected urinary tract infection  POCT Urinalysis no Micro    Urinalysis Reflex to Culture    cephALEXin (KEFLEX) 500 MG capsule   2. Chronic kidney disease, unspecified CKD stage  calcitRIOL (ROCALTROL) 0.25 MCG capsule    AFL([facility]) - Dorena Cookey, MD, Nephrology, [place]   3. Hematuria due to chronic cystitis  AFL - Jean Rosenthal, MD, Urology, [facility]   4. Primary hypertension     5. Multiple sclerosis ([place])             Plan:      Return in about 5 months (around 01/14/2021) for AWV.    Orders Placed This Encounter   Procedures   ??? Urinalysis Reflex to Culture     Standing Status:   Future     Number of  Occurrences:   1     Standing Expiration Date:   08/10/2021     Order Specific Question:   SPECIFY(EX-CATH,MIDSTREAM,CYSTO,ETC)?     Answer:   Midstream clean catch   ??? [facility]) - Dorena Cookey, MD, Nephrology, [facility]     Referral Priority:   Routine     Referral Type:   Eval and Treat     Referral Reason:   Specialty Services Required     Referred to Provider:   Darrol Angel, MD     Requested Specialty:   Nephrology     Number of Visits Requested:   1   ??? AFL - Jean Rosenthal, MD, Urology,  [place]     Referral Priority:   Routine     Referral Type:   Eval and Treat     Referral Reason:   Specialty Services Required     Referred to Provider:   Concha Pyo, MD     Requested Specialty:   Urology     Number of Visits Requested:   1   ??? POCT Urinalysis no Micro     Orders Placed This Encounter   Medications   ??? calcitRIOL (ROCALTROL) 0.25 MCG capsule     Sig: Take 2 capsules Mon, Wed, Fri, Sun and take 1 capsule on the opposite days     Dispense:  40 capsule     Refill:  2   ??? cephALEXin (KEFLEX) 500 MG capsule     Sig: Take 1 capsule by mouth 2 times daily for 7 days     Dispense:  14 capsule     Refill:  0         Patient given educational materials - see patient instructions.  Discussed use, benefit, and side effects of prescribedmedications.  All patient questions answered. Pt voiced understanding. Reviewed health maintenance.  Instructed to continue current medications, diet and exercise.  Patient agreed with treatment plan. Follow up as directed.    I spent a total of 20-29 minutes face to face with this patient.  Over 50% of that time was spent on counseling and care coordination.  Please see assessment and plan for details.      Electronically signed by   Evelina Dun, MD on 08/10/2020 at 1:23 PM  [facility].        Please note that this chart was generated using voice recognition [employer].  Although every effort was made to ensure the accuracy of this automatedtranscription,  some errors in transcription may have occurred.Levothyroxine 125 mcg

## 2020-08-10 NOTE — Other (Signed)
Urine culture showed E. coli.  Will advise patient to continue current treatment.  Further recommendations post urine culture and sensitivity results

## 2020-08-12 LAB — CULTURE, URINE

## 2020-08-14 LAB — CULTURE, AEROBIC

## 2020-08-27 NOTE — Telephone Encounter (Signed)
Order received for CT lung screening.  EMR and order reviewed. Information regarding ct lung screening and smoking cessation referral mailed to patient.

## 2020-09-02 ENCOUNTER — Encounter

## 2020-09-03 ENCOUNTER — Ambulatory Visit: Payer: MEDICARE | Primary: Diagnostic Radiology

## 2020-09-06 ENCOUNTER — Encounter

## 2020-09-06 MED ORDER — LEVOTHYROXINE SODIUM 125 MCG PO TABS
125 MCG | ORAL_TABLET | Freq: Every day | ORAL | 0 refills | Status: DC
Start: 2020-09-06 — End: 2020-10-06

## 2020-09-06 NOTE — Telephone Encounter (Signed)
Last Visit Date: 08/10/2020   Next Visit Date: 01/03/2021

## 2020-09-07 NOTE — Telephone Encounter (Signed)
Pt is requesting a letter to terminate her lease

## 2020-09-07 NOTE — Telephone Encounter (Signed)
-----   Message from Dory Larsen sent at 09/07/2020 12:52 PM EST -----  Subject: Message to Provider    QUESTIONS  Information for Provider? patient needs a letter to break her lease from   doctor for covid so she can move back to [place] to be with family   ---------------------------------------------------------------------------  --------------  [place]  What is the best way for the office to contact you? OK to leave message on   voicemail  Preferred Call Back Phone Number? [phone]  ---------------------------------------------------------------------------  --------------  SCRIPT ANSWERS  Relationship to Patient? Self

## 2020-09-08 NOTE — Telephone Encounter (Signed)
Which lease she is taking about. Kindly call her and let me know

## 2020-09-08 NOTE — Telephone Encounter (Signed)
Left voicemail asking patient to return call to office to discuss letter

## 2020-09-09 NOTE — Telephone Encounter (Signed)
Letter has been printed as requested.

## 2020-09-09 NOTE — Telephone Encounter (Signed)
Patient returned call to office stating that she needs a letter to terminate her current lease stating that due to her chronic medical conditions and recent colon cancer diagnosis that she is going to be moving to [place] so that her daughter can care for her while she is undergoing chemo/radiation treatments.

## 2020-10-06 ENCOUNTER — Encounter

## 2020-10-06 MED ORDER — LEVOTHYROXINE SODIUM 125 MCG PO TABS
125 MCG | ORAL_TABLET | Freq: Every day | ORAL | 0 refills | Status: AC
Start: 2020-10-06 — End: ?

## 2020-10-06 NOTE — Telephone Encounter (Signed)
Patient calling in office, states that she is staying with child for a little while out of state and is out of medcation. Asking for a refill to be sent to the pharmacy out there    Last Visit Date: 08/10/2020   Next Visit Date: Visit date not found

## 2020-10-11 ENCOUNTER — Ambulatory Visit: Payer: MEDICARE | Primary: Diagnostic Radiology

## 2020-10-12 NOTE — Telephone Encounter (Signed)
Dr. Lysbeth Galas office called requesting we fax over pt. cologuard results. Pt. Provided verbal approval for faxing medical records. Pt. Is currently in the process of establishing care w/ Dr. Christin Bach.      Phone: [phone]  Fax: [phone]      ~    Results printed and faxed to above fax number.

## 2020-11-02 NOTE — Telephone Encounter (Signed)
Left voice mail asking patient to return call regarding scheduling a mammogram and also checking if patent ended up moving to [place].  If so, need to remove PCP from chart.

## 2020-11-02 NOTE — Telephone Encounter (Signed)
Pt confirmed she has moved out of state. Reminded her of mamm being due

## 2021-01-03 ENCOUNTER — Encounter: Attending: Internal Medicine | Primary: Diagnostic Radiology

## 2021-01-14 ENCOUNTER — Encounter: Attending: Internal Medicine | Primary: Diagnostic Radiology

## 2021-05-27 NOTE — Telephone Encounter (Signed)
LVM to r/s 11.11.22 appt to next available at [facility]. Provider in [place] on Fridays.

## 2021-05-30 NOTE — Telephone Encounter (Signed)
LVM to r/s 11.11.22 appt to next available at [facility]. Provider in [place] on Fridays.

## 2021-06-13 NOTE — Telephone Encounter (Signed)
LVM to r/s 11.11.22 appt to next available at [facility], provider no longer at [facility]. [facility] on Fridays. Can schedule next available.

## 2021-07-29 ENCOUNTER — Encounter: Attending: Physician Assistant | Primary: Diagnostic Radiology

## 2021-08-02 NOTE — Telephone Encounter (Signed)
LVM to reschedule 07/29/21 appt due to provider no longer at [facility]. [facility] on Friday's. Can schedule next available with provider.

## 2021-08-30 NOTE — Telephone Encounter (Signed)
LVM to reschedule canceled appt from 07/29/21. Can schedule next available with provider.

## 2021-11-01 NOTE — Telephone Encounter (Signed)
Writer called pt to reschedule canceled appt. Pt states she has moved to [facility]. Writer has updated address and mailed pt release of medical records form.

## 2022-06-09 ENCOUNTER — Telehealth: Payer: Self-pay

## 2022-06-09 NOTE — Telephone Encounter (Signed)
Spoke with Ebony Hail from [facility]YN[facility] requesting surgical pathology from 01/2018.  Printed surgical pathology from 01/2018 and faxed to [phone] per her request.
